# Patient Record
Sex: Female | Born: 1970 | Race: White | Hispanic: No | Marital: Married | Smoking: Never smoker
Health system: Southern US, Community
[De-identification: ages and names within clinical notes are randomized; demographics above are authoritative.]

## PROBLEM LIST (undated history)

## (undated) DIAGNOSIS — T7840XA Allergy, unspecified, initial encounter: Secondary | ICD-10-CM

## (undated) DIAGNOSIS — K589 Irritable bowel syndrome without diarrhea: Secondary | ICD-10-CM

## (undated) DIAGNOSIS — K219 Gastro-esophageal reflux disease without esophagitis: Secondary | ICD-10-CM

## (undated) DIAGNOSIS — M1990 Unspecified osteoarthritis, unspecified site: Secondary | ICD-10-CM

## (undated) DIAGNOSIS — N809 Endometriosis, unspecified: Secondary | ICD-10-CM

## (undated) DIAGNOSIS — J301 Allergic rhinitis due to pollen: Secondary | ICD-10-CM

## (undated) DIAGNOSIS — J309 Allergic rhinitis, unspecified: Secondary | ICD-10-CM

## (undated) HISTORY — DX: Irritable bowel syndrome, unspecified: K58.9

## (undated) HISTORY — DX: Gastro-esophageal reflux disease without esophagitis: K21.9

## (undated) HISTORY — DX: Allergic rhinitis due to pollen: J30.1

## (undated) HISTORY — DX: Endometriosis, unspecified: N80.9

## (undated) HISTORY — PX: UPPER GASTROINTESTINAL ENDOSCOPY: SHX188

## (undated) HISTORY — DX: Unspecified osteoarthritis, unspecified site: M19.90

## (undated) HISTORY — DX: Allergy, unspecified, initial encounter: T78.40XA

## (undated) HISTORY — DX: Allergic rhinitis, unspecified: J30.9

## (undated) HISTORY — DX: Irritable bowel syndrome without diarrhea: K58.9

---

## 1997-06-18 HISTORY — PX: TONSILLECTOMY: SUR1361

## 2004-10-16 ENCOUNTER — Ambulatory Visit: Payer: Self-pay

## 2005-06-18 HISTORY — PX: NASAL STENOSIS REPAIR: SHX2071

## 2005-09-11 ENCOUNTER — Ambulatory Visit: Payer: Self-pay | Admitting: Otolaryngology

## 2006-02-20 ENCOUNTER — Ambulatory Visit: Payer: Self-pay | Admitting: Internal Medicine

## 2006-03-05 ENCOUNTER — Ambulatory Visit: Payer: Self-pay | Admitting: Internal Medicine

## 2006-04-10 ENCOUNTER — Ambulatory Visit: Payer: Self-pay | Admitting: Internal Medicine

## 2008-03-19 ENCOUNTER — Ambulatory Visit: Payer: Self-pay | Admitting: Family Medicine

## 2008-03-19 DIAGNOSIS — J069 Acute upper respiratory infection, unspecified: Secondary | ICD-10-CM | POA: Insufficient documentation

## 2008-06-17 ENCOUNTER — Ambulatory Visit: Payer: Self-pay | Admitting: Family Medicine

## 2008-07-02 ENCOUNTER — Ambulatory Visit: Payer: Self-pay | Admitting: Family Medicine

## 2008-07-02 DIAGNOSIS — R0982 Postnasal drip: Secondary | ICD-10-CM | POA: Insufficient documentation

## 2008-07-15 ENCOUNTER — Ambulatory Visit: Payer: Self-pay | Admitting: Family Medicine

## 2008-07-15 DIAGNOSIS — J3489 Other specified disorders of nose and nasal sinuses: Secondary | ICD-10-CM | POA: Insufficient documentation

## 2008-07-21 ENCOUNTER — Telehealth (INDEPENDENT_AMBULATORY_CARE_PROVIDER_SITE_OTHER): Payer: Self-pay | Admitting: Internal Medicine

## 2009-01-21 ENCOUNTER — Telehealth (INDEPENDENT_AMBULATORY_CARE_PROVIDER_SITE_OTHER): Payer: Self-pay | Admitting: Internal Medicine

## 2009-03-04 ENCOUNTER — Encounter: Payer: Self-pay | Admitting: Internal Medicine

## 2009-03-17 ENCOUNTER — Ambulatory Visit: Payer: Self-pay | Admitting: Family Medicine

## 2009-03-17 DIAGNOSIS — J019 Acute sinusitis, unspecified: Secondary | ICD-10-CM | POA: Insufficient documentation

## 2009-03-21 ENCOUNTER — Telehealth (INDEPENDENT_AMBULATORY_CARE_PROVIDER_SITE_OTHER): Payer: Self-pay | Admitting: Internal Medicine

## 2009-03-22 ENCOUNTER — Telehealth (INDEPENDENT_AMBULATORY_CARE_PROVIDER_SITE_OTHER): Payer: Self-pay | Admitting: Internal Medicine

## 2009-03-31 ENCOUNTER — Encounter (INDEPENDENT_AMBULATORY_CARE_PROVIDER_SITE_OTHER): Payer: Self-pay | Admitting: Internal Medicine

## 2009-04-27 ENCOUNTER — Ambulatory Visit: Payer: Self-pay | Admitting: Otolaryngology

## 2009-05-04 ENCOUNTER — Ambulatory Visit: Payer: Self-pay | Admitting: Otolaryngology

## 2010-11-03 NOTE — Assessment & Plan Note (Signed)
[facility]                           GASTROENTEROLOGY OFFICE NOTE   NAME:Tammie Wright, Tammie Wright                    MRN:          [id number]  DATE:02/20/2006                            DOB:          15-Apr-1971    CHIEF COMPLAINT:  Reflux, irritable bowel.   HISTORY:  This is a 40 year old white woman that says for several years she  has developed alternating bowel habits with hard and loose stools.  There is  sometimes urgent defecation and incontinence.  Sometimes she has to defecate  when she fells like it is just an urge to urinate.  Milk products bother her  and make her gassy and cause urgent defecation.  She also has fairly chronic  heartburn several times a week.  Tomato-based foods make it much worse.  A  14-day course of Prilosec OTC controlled these reflux symptoms quite well,  though they returned when she stopped that.  She has minimized caffeine down  to about two doses a day.  She does not smoke.  She is obese though her  weight has not been progressively increasing.  Recently saw her gynecologist  where TSH and prolactin levels were normal, she tells me.  There is no  bleeding.  There is no family history of inflammatory bowel disease or colon  cancer.   PAST MEDICAL HISTORY:  1. 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  2. Tonsillectomy August 1999.  3. Allergies and sinus problems.   MEDICATIONS:  Clarinex 5 mg daily.   DRUG ALLERGIES:  None known.   SOCIAL HISTORY:  She is married.  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  No alcohol, tobacco or drugs.  No  children.   REVIEW OF SYSTEMS:  Some pain with periods at times.  Last menstrual period  began 02/16/06; just finishing.  She wears eye glasses and she has allergy  problems.   All other systems negative.   PHYSICAL EXAMINATION:  Height 5 feet, weight 172 pounds, blood pressure  100/62, pulse 88.  EYES:   Anicteric.  ENT:  Normal mouth and posterior pharynx.  NECK:  Supple.  No mass.  CHEST:  Clear.  HEART:  S1, S2.  No murmurs or gallops.  ABDOMEN:  Obese, soft, and nontender.  There is no organomegaly or mass.  There is one line of striae in the right lower quadrant.  RECTAL:  Rectal exam with female nursing staff present shows Hemoccult  negative stool.  No mass.  LYMPHATIC:  No neck or supraclavicular nodes.  EXTREMITIES:  No edema.  SKIN:  No rash.  NEURO:  She is alert and oriented x3.   ASSESSMENT:  1. Symptoms most compatible with gastroesophageal reflux disease with      heartburn symptoms for several years, not yet five years.  2. Some crampy abdominal pain in the lower quadrants with alternating      bowel habits.  It sounds like she does have some lactose intolerance      perhaps and it sounds like irritable syndrome to me.   PLAN:  1.  CBC with diff, CMET.  2. Isamine 0.125 mg as needed for cramps and pain.  3. Fiber supplement, samples of Fibersure are given to her to try and see      if this regulates her bowel movements.  4. Handouts on irritable bowel syndrome and gastroesophageal reflux      disease are given.  5. I will see her back in six weeks, she has a pending physical exam with      Dr. Alphonsus Sias pending.  6. I can consider probiotics versus an antibiotic trial for possible small      bowel bacterial overgrowth depending upon her response to the above      measures.                                   Iva Boop, MD,FACG   CEG/MedQ  DD:  02/20/2006  DT:  02/21/2006  Job #:  [id number]   cc:   Karie Schwalbe, MD  Dr. Dallas Schimke

## 2010-11-03 NOTE — Assessment & Plan Note (Signed)
[facility]                           GASTROENTEROLOGY OFFICE NOTE   NAME:Tammie Wright, Tammie Wright                    MRN:          [id number]  DATE:04/10/2006                            DOB:          [birth year]    ASSESSMENT:  1. Gastroesophageal reflux disease, under good control on Prilosec OTC      daily.  2. Irritable bowel syndrome, under good control on fiber supplement with      rare use of hyoscyamine.   See medical history physical form for full details.   VITAL SIGNS:  Weight 173 pounds.  Pulse 64.  Blood pressure 120/74.   PLAN:  1. Continue current therapy.  I do not think she needs a endoscopy at this      time.  After she has had Prilosec for about 5 years (it sounds to me      like she needs this chronically), we can consider to look for      Barrett's' esophagus.  2. Return to see me as needed.       Iva Boop, MD,FACG      CEG/MedQ  DD:  04/10/2006  DT:  04/11/2006  Job #:  [id number]   cc:   Karie Schwalbe, MD

## 2011-03-23 ENCOUNTER — Encounter: Payer: Self-pay | Admitting: Internal Medicine

## 2011-05-16 ENCOUNTER — Encounter: Payer: Self-pay | Admitting: Internal Medicine

## 2011-05-18 ENCOUNTER — Ambulatory Visit (INDEPENDENT_AMBULATORY_CARE_PROVIDER_SITE_OTHER): Payer: BC Managed Care – PPO | Admitting: Internal Medicine

## 2011-05-18 ENCOUNTER — Encounter: Payer: Self-pay | Admitting: Internal Medicine

## 2011-05-18 DIAGNOSIS — K219 Gastro-esophageal reflux disease without esophagitis: Secondary | ICD-10-CM | POA: Insufficient documentation

## 2011-05-18 DIAGNOSIS — J309 Allergic rhinitis, unspecified: Secondary | ICD-10-CM | POA: Insufficient documentation

## 2011-05-18 DIAGNOSIS — K589 Irritable bowel syndrome without diarrhea: Secondary | ICD-10-CM | POA: Insufficient documentation

## 2011-05-18 DIAGNOSIS — Z0000 Encounter for general adult medical examination without abnormal findings: Secondary | ICD-10-CM

## 2011-05-18 LAB — CBC WITH DIFFERENTIAL/PLATELET
Basophils Relative: 0.3 % (ref 0.0–3.0)
Eosinophils Absolute: 0.1 10*3/uL (ref 0.0–0.7)
Hemoglobin: 13.7 g/dL (ref 12.0–15.0)
MCHC: 33.5 g/dL (ref 30.0–36.0)
MCV: 88.1 fl (ref 78.0–100.0)
Monocytes Absolute: 0.4 10*3/uL (ref 0.1–1.0)
Neutro Abs: 4.5 10*3/uL (ref 1.4–7.7)
RBC: 4.64 Mil/uL (ref 3.87–5.11)

## 2011-05-18 LAB — BASIC METABOLIC PANEL
CO2: 27 mEq/L (ref 19–32)
Chloride: 106 mEq/L (ref 96–112)
Sodium: 141 mEq/L (ref 135–145)

## 2011-05-18 LAB — LIPID PANEL
Total CHOL/HDL Ratio: 3
Triglycerides: 119 mg/dL (ref 0.0–149.0)

## 2011-05-18 LAB — HEPATIC FUNCTION PANEL
ALT: 18 U/L (ref 0–35)
Total Protein: 7.9 g/dL (ref 6.0–8.3)

## 2011-05-18 NOTE — Assessment & Plan Note (Signed)
On immunotherapy and zyrtec

## 2011-05-18 NOTE — Assessment & Plan Note (Signed)
Mostly controlled on the omeprazole

## 2011-05-18 NOTE — Assessment & Plan Note (Signed)
Healthy but really out of shape Discussed weight watchers and resuming regular exercise Will check lipid and sugar

## 2011-05-18 NOTE — Progress Notes (Signed)
Subjective:    Patient ID: Tammie Wright, female    DOB: [birth year], 40 y.o.   MRN: [id number]  HPI Here for physical  Gets regular gyn care with Dr Dallas Schimke at [facility] for allergy shots for about 1 year  Uses the omeprazole for reflux Still occ gets some water brash  Weight consistently going up Has not been exercising  No set dietary regimen--discussed weight watchers  Current Outpatient Prescriptions on File Prior to Visit  Medication Sig Dispense Refill  . omeprazole (PRILOSEC) 20 MG capsule Take 20 mg by mouth daily.          No Known Allergies  Past Medical History  Diagnosis Date  . Allergic rhinitis, cause unspecified   . GERD (gastroesophageal reflux disease)     Past Surgical History  Procedure Date  . Tonsillectomy 1999    had remnant removed 2010 by Dr Willeen Cass  . Nasal stenosis repair 2007    Dr Willeen Cass    Family History  Problem Relation Age of Onset  . Hypertension Maternal Grandmother   . Heart disease Maternal Grandfather   . Diabetes Neg Hx     History   Social History  . Marital Status: Married    Spouse Name: N/A    Number of Children: 0  . Years of Education: N/A   Occupational History  . Patient accounts     [employer]   Social History Main Topics  . Smoking status: Never Smoker   . Smokeless tobacco: Not on file  . Alcohol Use: Not on file  . Drug Use: Not on file  . Sexually Active: Not on file   Other Topics Concern  . Not on file   Social History Narrative  . No narrative on file   Review of Systems  Constitutional: Positive for unexpected weight change. Negative for fatigue.       Wears seat belt  HENT: Positive for congestion and rhinorrhea. Negative for hearing loss, dental problem and tinnitus.        Keeps up with dentist  Eyes: Negative for visual disturbance.       No diplopia or unilateral vision loss Recent eye exam  Respiratory: Negative for cough, chest tightness and shortness of breath.    Cardiovascular: Negative for chest pain, palpitations and leg swelling.  Gastrointestinal: Positive for abdominal pain. Negative for nausea, vomiting and blood in stool.       Occ pain with IBS flare Constipation alternates with diarrhea--more active when has her period Hasn't needed meds Heartburn generally controlled  Genitourinary: Negative for dysuria, difficulty urinating and dyspareunia.       No incontinence  Musculoskeletal: Negative for back pain and joint swelling.       Occ soreness in hands--on keyboard computer all day  Skin: Positive for rash.       Has itchy spot on upper arm--cortisone helps  Neurological: Negative for dizziness, syncope, weakness, light-headedness, numbness and headaches.  Hematological: Negative for adenopathy. Does not bruise/bleed easily.  Psychiatric/Behavioral: Negative for sleep disturbance and dysphoric mood. The patient is not nervous/anxious.        Objective:   Physical Exam  Constitutional: She is oriented to person, place, and time. She appears well-developed and well-nourished. No distress.  HENT:  Head: Normocephalic and atraumatic.  Right Ear: External ear normal.  Left Ear: External ear normal.  Mouth/Throat: Oropharynx is clear and moist. No oropharyngeal exudate.       TMs normal  Eyes: Conjunctivae  and EOM are normal. Pupils are equal, round, and reactive to light.       Fundi benign  Neck: Normal range of motion. Neck supple. No thyromegaly present.  Cardiovascular: Normal rate, regular rhythm, normal heart sounds and intact distal pulses.  Exam reveals no gallop.   No murmur heard. Pulmonary/Chest: Effort normal and breath sounds normal. No respiratory distress. She has no wheezes. She has no rales.  Abdominal: Soft. There is no tenderness.  Musculoskeletal: Normal range of motion. She exhibits no edema and no tenderness.  Lymphadenopathy:    She has no cervical adenopathy.  Neurological: She is alert and oriented to  person, place, and time.  Skin: Skin is warm. Rash noted.       Mycotic left great toenail Mild irritative rash on left arm  Psychiatric: She has a normal mood and affect. Her behavior is normal. Judgment and thought content normal.          Assessment & Plan:

## 2011-06-19 HISTORY — PX: OTHER SURGICAL HISTORY: SHX169

## 2012-06-18 HISTORY — PX: OTHER SURGICAL HISTORY: SHX169

## 2013-02-16 HISTORY — PX: COLONOSCOPY: SHX174

## 2013-03-11 ENCOUNTER — Ambulatory Visit: Payer: Self-pay | Admitting: Gastroenterology

## 2013-06-01 ENCOUNTER — Encounter: Payer: Self-pay | Admitting: Family Medicine

## 2013-06-01 ENCOUNTER — Ambulatory Visit (INDEPENDENT_AMBULATORY_CARE_PROVIDER_SITE_OTHER): Payer: BC Managed Care – PPO | Admitting: Family Medicine

## 2013-06-01 VITALS — BP 124/80 | HR 110 | Temp 98.2°F | Ht 59.75 in | Wt 197.8 lb

## 2013-06-01 DIAGNOSIS — M542 Cervicalgia: Secondary | ICD-10-CM

## 2013-06-01 NOTE — Progress Notes (Signed)
Pre-visit discussion using our clinic review tool. No additional management support is needed unless otherwise documented below in the visit note.  
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
## 2013-06-03 ENCOUNTER — Encounter: Payer: Self-pay | Admitting: *Deleted

## 2013-06-03 ENCOUNTER — Telehealth: Payer: Self-pay | Admitting: *Deleted
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
## 2014-01-16 HISTORY — PX: OOPHORECTOMY: SHX86

## 2014-01-25 ENCOUNTER — Ambulatory Visit: Payer: Self-pay | Admitting: Obstetrics and Gynecology

## 2014-01-25 LAB — CBC
HCT: 44.2 % (ref 35.0–47.0)
HGB: 14.3 g/dL (ref 12.0–16.0)
MCH: 30.6 pg (ref 26.0–34.0)
MCHC: 32.3 g/dL (ref 32.0–36.0)
MCV: 95 fL (ref 80–100)
Platelet: 304 10*3/uL (ref 150–440)
RBC: 4.66 10*6/uL (ref 3.80–5.20)
RDW: 12.8 % (ref 11.5–14.5)
WBC: 9.5 10*3/uL (ref 3.6–11.0)

## 2014-01-25 LAB — BASIC METABOLIC PANEL
Anion Gap: 6 — ABNORMAL LOW (ref 7–16)
BUN: 8 mg/dL (ref 7–18)
Calcium, Total: 8.3 mg/dL — ABNORMAL LOW (ref 8.5–10.1)
Chloride: 105 mmol/L (ref 98–107)
Co2: 30 mmol/L (ref 21–32)
Creatinine: 0.77 mg/dL (ref 0.60–1.30)
EGFR (African American): >60
EGFR (Non-African Amer.): >60
Glucose: 122 mg/dL — ABNORMAL HIGH (ref 65–99)
Osmolality: 281 (ref 275–301)
Potassium: 3.7 mmol/L (ref 3.5–5.1)
Sodium: 141 mmol/L (ref 136–145)

## 2014-01-25 LAB — SEDIMENTATION RATE: Erythrocyte Sed Rate: 14 mm/hr (ref 0–20)

## 2014-02-04 ENCOUNTER — Ambulatory Visit: Payer: Self-pay | Admitting: Obstetrics and Gynecology

## 2014-02-05 LAB — PATHOLOGY REPORT

## 2014-05-18 ENCOUNTER — Ambulatory Visit: Payer: Self-pay | Admitting: Obstetrics and Gynecology

## 2014-09-03 ENCOUNTER — Encounter: Payer: Self-pay | Admitting: Internal Medicine

## 2014-10-09 NOTE — Op Note (Signed)
PATIENT NAME:  Tammie Wright, Tammie Wright MR#:  [id number] DATE OF BIRTH:  July 25, 1970  DATE OF PROCEDURE:  02/04/2014  PROCEDURE PERFORMED: Laparoscopic left salpingo-oophorectomy, right cystectomy, right salpingectomy and peritoneal biopsies.     PREOPERATIVE DIAGNOSIS: Right ovarian cyst, left severe dyspareunia and constant pain unresponsive to medical management.   ESTIMATED BLOOD LOSS: 50 mL.   FINDINGS: Bowel adhered to the left side wall encompassing the left tube and ovary with large amounts of endometriosis coursing over the ureter with posterior cul-de-sac endometriosis implants, as well as Wright right ovarian cyst, right normal tube and peritoneal implants of endometriosis at the bladder.   DESCRIPTION OF PROCEDURE: The patient was taken to the Operating Room and placed in supine position. After adequate general endotracheal anesthesia was instilled, the patient was prepped and draped in the usual sterile fashion. Wright side-opening speculum was placed in the patient's vagina. The anterior lip of the cervix was grasped with Wright single-tooth tenaculum. The uterus was sounded and Wright Hulka tenaculum was placed.  Speculum was removed.  Umbilicus was injected with Marcaine. An incision was made.  Veress needle was placed.  Hang drop test, fluid instillation test, and fluid aspiration test showed proper placement of the Veress needle. The CO2 was then placed on low flow.  When tympany was heard around the liver, CO2 was placed on high flow.  The Veress needle was removed and Wright 10 mm trocar port was placed through the umbilicus.  The patient was placed in Trendelenburg and the aforementioned findings were seen. The right ovarian cyst was ruptured.  The right tube was grasped and doused with Marcaine. Harmonic scalpel was used to cut across the broad ligament and the tube was removed. The stump was approximately 1.5 cm and was cauterized.  Attention was then turned to the left side where the infundibulopelvic ligament was  dissected out of the endometriosis.  The bowels were dissected free.  The ureter was identified and traced.  Endometriosis was found to be on top of the ureter.  The IP was clamped and cauterized with the Harmonic scalpel.  The broad ligament was incised to the approximately 1.5 cm from the uterus and the tube was cut across.  The tube and ovary were then removed through the trocar ports.  The ureter was identified and found to be free from endometriosis and free from any injury.  The posterior peritoneum and the bladder peritoneum was injected with Marcaine and the Harmonic scalpel was used to remove the implants.  These implants were then taken and sent to pathology.  The remainder of the Marcaine was placed in the patient's abdomen and the trocars were removed.  CO2 was allowed to escape. Good hemostasis was identified.  The ports were sewn and placed with Dermabond.  Bandages were placed.   Hulka tenaculum was removed.  The patient was taken to recovery after having tolerated the procedure well.      ____________________________ Elliot Gurneyarrie C. Kiowa Peifer, MD cck:DT D: 02/09/2014 09:58:07 ET T: 02/09/2014 15:32:25 ET JOB#: [id number]  cc: Elliot Gurneyarrie C. Savio Albrecht, MD, <Dictator> Elliot GurneyARRIE C Maksym Pfiffner MD ELECTRONICALLY SIGNED 02/09/2014 20:34

## 2015-07-21 ENCOUNTER — Other Ambulatory Visit: Payer: Self-pay | Admitting: Obstetrics and Gynecology

## 2015-07-21 DIAGNOSIS — Z1231 Encounter for screening mammogram for malignant neoplasm of breast: Secondary | ICD-10-CM

## 2015-09-05 ENCOUNTER — Ambulatory Visit
Admission: RE | Admit: 2015-09-05 | Discharge: 2015-09-05 | Disposition: A | Discharge status: Home / Self Care | Payer: BC Managed Care – PPO | Source: Ambulatory Visit | Attending: Obstetrics and Gynecology | Admitting: Obstetrics and Gynecology

## 2015-09-05 DIAGNOSIS — Z1231 Encounter for screening mammogram for malignant neoplasm of breast: Secondary | ICD-10-CM | POA: Diagnosis present

## 2015-09-05 LAB — HM PAP SMEAR: HM Pap smear: NORMAL

## 2015-11-30 ENCOUNTER — Ambulatory Visit: Payer: BC Managed Care – PPO | Admitting: Internal Medicine

## 2016-01-23 ENCOUNTER — Encounter: Payer: Self-pay | Admitting: Internal Medicine

## 2016-01-23 ENCOUNTER — Ambulatory Visit (INDEPENDENT_AMBULATORY_CARE_PROVIDER_SITE_OTHER): Payer: BC Managed Care – PPO | Admitting: Internal Medicine

## 2016-01-23 DIAGNOSIS — K219 Gastro-esophageal reflux disease without esophagitis: Secondary | ICD-10-CM | POA: Diagnosis not present

## 2016-01-23 DIAGNOSIS — J301 Allergic rhinitis due to pollen: Secondary | ICD-10-CM | POA: Diagnosis not present

## 2016-01-23 MED ORDER — PANTOPRAZOLE SODIUM 40 MG PO TBEC: 40.0000 mg | DELAYED_RELEASE_TABLET | Freq: Every day | ORAL | 3 refills | Status: DC

## 2016-01-23 MED ORDER — MONTELUKAST SODIUM 10 MG PO TABS: 10.0000 mg | ORAL_TABLET | Freq: Every day | ORAL | 3 refills | Status: DC

## 2016-01-23 NOTE — Assessment & Plan Note (Signed)
Most troubling problem Will add montelukast Consider adding fexofenadine also May need to consider immunotherapy

## 2016-01-23 NOTE — Progress Notes (Signed)
Pre visit review using our clinic review tool, if applicable. No additional management support is needed unless otherwise documented below in the visit note. 

## 2016-01-23 NOTE — Assessment & Plan Note (Signed)
Does well with the PPI 

## 2016-01-23 NOTE — Patient Instructions (Signed)
Please add the montelukast daily (singulair) to the flonase and cetirizine. You can also add fexofenadine  daily in addition. If all that doesn't help, let me know.

## 2016-01-23 NOTE — Progress Notes (Signed)
Subjective:    Patient ID: Tammie Wright, female    DOB: 1971/04/17, 45 y.o.   MRN: [id number]  HPI Here to reestablish Hasn't been seen in 5 years [facility]  Having a lot of problems with her allergies Cetirizine helps a little Loratadine doesn't do anything Still has itchy eyes and ears No wheezing, cough or SOB (only coughs if bad PND) Has used fexofenadine in past--that did help Still on flonase Drops for eyes from eye doctor Montelukast in past-- it did help somewhat (got from her ENT) Symptoms all year round No animals Carpeting through house--- did remove some--but floor is dusty Discussed plastic bedding covers  Needs Rx for her protonix This controls her reflux Started in 2014 by Dr Shelle Ironein after EGD  Current Outpatient Prescriptions on File Prior to Visit  Medication Sig Dispense Refill  . cetirizine (ZYRTEC) 10 MG tablet Take 10 mg by mouth daily.      . pantoprazole (PROTONIX) 40 MG tablet Take 40 mg by mouth daily.    . fluticasone (FLONASE) 50 MCG/ACT nasal spray Place 1 spray into the nose daily.       No current facility-administered medications on file prior to visit.     No Known Allergies  Past Medical History:  Diagnosis Date  . Allergic rhinitis due to pollen   . Allergic rhinitis, cause unspecified   . Endometriosis   . GERD (gastroesophageal reflux disease)   . IBS (irritable bowel syndrome)     Past Surgical History:  Procedure Laterality Date  . COLONOSCOPY  9/14   and EGD--Dr Shelle Ironein  . NASAL STENOSIS REPAIR  2007   Dr Willeen CassBennett  . OOPHORECTOMY Left 01/2014   and both tubes---for endometriosis  . TONSILLECTOMY  1999   had remnant removed 2010 by Dr Willeen CassBennett  . Uterine ablation  2014    Family History  Problem Relation Age of Onset  . Cancer Father     bladder cancer  . Hypertension Maternal Grandmother   . Heart disease Maternal Grandfather   . Cancer Paternal Aunt     breast cancer  . Diabetes Neg Hx      Social History   Social History  . Marital status: Married    Spouse name: N/A  . Number of children: 0  . Years of education: N/A   Occupational History  . Patient accounts     [employer]   Social History Main Topics  . Smoking status: Never Smoker  . Smokeless tobacco: Never Used  . Alcohol use No  . Drug use: No  . Sexual activity: Not on file   Other Topics Concern  . Not on file   Social History Narrative  . No narrative on file   Review of Systems  Has lost 13# since last visit No specific effort--some increased walking Sleeps great    Objective:   Physical Exam  Constitutional: She appears well-developed and well-nourished. No distress.  HENT:  Mouth/Throat: Oropharynx is clear and moist. No oropharyngeal exudate.  Moderate nasal inflammation--mostly on right TMs normal  Neck: Normal range of motion. Neck supple. No thyromegaly present.  Cardiovascular: Normal rate, regular rhythm and normal heart sounds.  Exam reveals no gallop.   No murmur heard. Pulmonary/Chest: Effort normal and breath sounds normal. No respiratory distress. She has no wheezes. She has no rales.  Abdominal: Soft. There is no tenderness.  Musculoskeletal: She exhibits no edema.  Lymphadenopathy:    She has no cervical  adenopathy.  Psychiatric: She has a normal mood and affect. Her behavior is normal.          Assessment & Plan:

## 2016-03-21 ENCOUNTER — Encounter: Payer: Self-pay | Admitting: Internal Medicine

## 2016-03-22 MED ORDER — DICYCLOMINE HCL 10 MG PO CAPS: 10.0000 mg | ORAL_CAPSULE | Freq: Three times a day (TID) | ORAL | 0 refills | Status: DC | PRN

## 2016-05-17 ENCOUNTER — Encounter: Payer: Self-pay | Admitting: Internal Medicine

## 2016-11-20 ENCOUNTER — Encounter: Payer: Self-pay | Admitting: Internal Medicine

## 2016-11-21 ENCOUNTER — Encounter: Payer: Self-pay | Admitting: Internal Medicine

## 2016-11-21 ENCOUNTER — Ambulatory Visit (INDEPENDENT_AMBULATORY_CARE_PROVIDER_SITE_OTHER): Payer: BC Managed Care – PPO | Admitting: Internal Medicine

## 2016-11-21 ENCOUNTER — Ambulatory Visit (INDEPENDENT_AMBULATORY_CARE_PROVIDER_SITE_OTHER)
Admission: RE | Admit: 2016-11-21 | Discharge: 2016-11-21 | Disposition: A | Discharge status: Home / Self Care | Payer: BC Managed Care – PPO | Source: Ambulatory Visit | Attending: Internal Medicine | Admitting: Internal Medicine

## 2016-11-21 DIAGNOSIS — M79672 Pain in left foot: Secondary | ICD-10-CM

## 2016-11-21 NOTE — Assessment & Plan Note (Addendum)
And bony prominence at 3rd metatarsal ?related to wearing heels more often--clearly seems mechanical X-ray reassuring Discussed supportive care

## 2016-11-21 NOTE — Progress Notes (Signed)
   Subjective:    Patient ID: Tammie Wright, female    DOB: Apr 14, 1971, 46 y.o.   MRN: [id number]  HPI Here due to foot pain  Didn't remember any injury Then noticed bump on top of left foot several days ago---but then noticed it very severe  2 days ago  Has been wearing heels more often No redness No discharge  Current Outpatient Prescriptions on File Prior to Visit  Medication Sig Dispense Refill  . cetirizine (ZYRTEC) 10 MG tablet Take 10 mg by mouth daily.      Marland Kitchen. dicyclomine (BENTYL) 10 MG capsule Take 1 capsule (10 mg total) by mouth 3 (three) times daily as needed for spasms. 60 capsule 0  . fluticasone (FLONASE) 50 MCG/ACT nasal spray Place 1 spray into the nose daily.      . montelukast (SINGULAIR) 10 MG tablet Take 1 tablet (10 mg total) by mouth daily. 90 tablet 3  . pantoprazole (PROTONIX) 40 MG tablet Take 1 tablet (40 mg total) by mouth daily. 90 tablet 3   No current facility-administered medications on file prior to visit.     No Known Allergies  Past Medical History:  Diagnosis Date  . Allergic rhinitis due to pollen   . Allergic rhinitis, cause unspecified   . Endometriosis   . GERD (gastroesophageal reflux disease)   . IBS (irritable bowel syndrome)     Past Surgical History:  Procedure Laterality Date  . COLONOSCOPY  9/14   and EGD--Dr Shelle Ironein  . NASAL STENOSIS REPAIR  2007   Dr Willeen CassBennett  . OOPHORECTOMY Left 01/2014   and both tubes---for endometriosis  . TONSILLECTOMY  1999   had remnant removed 2010 by Dr Willeen CassBennett  . Uterine ablation  2014    Family History  Problem Relation Age of Onset  . Cancer Father        bladder cancer  . Hypertension Maternal Grandmother   . Heart disease Maternal Grandfather   . Cancer Paternal Aunt        breast cancer  . Diabetes Neg Hx     Social History   Social History  . Marital status: Married    Spouse name: N/A  . Number of children: 0  . Years of education: N/A   Occupational History  . Patient  accounts     [employer]   Social History Main Topics  . Smoking status: Never Smoker  . Smokeless tobacco: Never Used  . Alcohol use No  . Drug use: No  . Sexual activity: Not on file   Other Topics Concern  . Not on file   Social History Narrative  . No narrative on file   Review of Systems No fever Feels well other than worsened sinus symptoms in past day or so    Objective:   Physical Exam  Musculoskeletal:  Tenderness and prominence at proximal edge of 3rd metatarsal head No redness or warmth          Assessment & Plan:

## 2016-11-23 ENCOUNTER — Encounter: Payer: Self-pay | Admitting: Internal Medicine

## 2017-01-17 ENCOUNTER — Encounter: Payer: Self-pay | Admitting: Internal Medicine

## 2017-01-17 MED ORDER — MONTELUKAST SODIUM 10 MG PO TABS: 10.0000 mg | ORAL_TABLET | Freq: Every day | ORAL | 3 refills | Status: DC

## 2017-01-25 ENCOUNTER — Ambulatory Visit (INDEPENDENT_AMBULATORY_CARE_PROVIDER_SITE_OTHER): Payer: BC Managed Care – PPO | Admitting: Internal Medicine

## 2017-01-25 ENCOUNTER — Encounter: Payer: Self-pay | Admitting: Internal Medicine

## 2017-01-25 VITALS — BP 130/88 | HR 103 | Temp 98.0°F | Ht 59.75 in | Wt 190.0 lb

## 2017-01-25 DIAGNOSIS — Z0000 Encounter for general adult medical examination without abnormal findings: Secondary | ICD-10-CM | POA: Diagnosis not present

## 2017-01-25 DIAGNOSIS — E669 Obesity, unspecified: Secondary | ICD-10-CM | POA: Diagnosis not present

## 2017-01-25 DIAGNOSIS — Z23 Encounter for immunization: Secondary | ICD-10-CM | POA: Diagnosis not present

## 2017-01-25 LAB — CBC WITH DIFFERENTIAL/PLATELET
Basophils Absolute: 0 K/uL (ref 0.0–0.1)
Basophils Relative: 0.3 % (ref 0.0–3.0)
Eosinophils Absolute: 0.1 K/uL (ref 0.0–0.7)
Eosinophils Relative: 1.1 % (ref 0.0–5.0)
HCT: 42.5 % (ref 36.0–46.0)
Hemoglobin: 14.1 g/dL (ref 12.0–15.0)
Lymphocytes Relative: 20.8 % (ref 12.0–46.0)
Lymphs Abs: 1.4 K/uL (ref 0.7–4.0)
MCHC: 33.2 g/dL (ref 30.0–36.0)
MCV: 94.9 fl (ref 78.0–100.0)
Monocytes Absolute: 0.4 K/uL (ref 0.1–1.0)
Monocytes Relative: 6.1 % (ref 3.0–12.0)
Neutro Abs: 4.8 K/uL (ref 1.4–7.7)
Neutrophils Relative %: 71.7 % (ref 43.0–77.0)
Platelets: 334 K/uL (ref 150.0–400.0)
RBC: 4.48 Mil/uL (ref 3.87–5.11)
RDW: 12.6 % (ref 11.5–15.5)
WBC: 6.6 K/uL (ref 4.0–10.5)

## 2017-01-25 LAB — COMPREHENSIVE METABOLIC PANEL
ALBUMIN: 4.4 g/dL (ref 3.5–5.2)
ALT: 17 U/L (ref 0–35)
AST: 14 U/L (ref 0–37)
Alkaline Phosphatase: 65 U/L (ref 39–117)
BILIRUBIN TOTAL: 0.4 mg/dL (ref 0.2–1.2)
BUN: 17 mg/dL (ref 6–23)
CALCIUM: 9.4 mg/dL (ref 8.4–10.5)
CHLORIDE: 103 meq/L (ref 96–112)
CO2: 29 mEq/L (ref 19–32)
CREATININE: 0.79 mg/dL (ref 0.40–1.20)
GFR: 83.91 mL/min (ref >60.00)
Glucose, Bld: 107 mg/dL — ABNORMAL HIGH (ref 70–99)
Potassium: 4.1 mEq/L (ref 3.5–5.1)
SODIUM: 138 meq/L (ref 135–145)
Total Protein: 7.8 g/dL (ref 6.0–8.3)

## 2017-01-25 LAB — LIPID PANEL
CHOLESTEROL: 150 mg/dL (ref 0–200)
HDL: 48.8 mg/dL (ref >39.00)
LDL Cholesterol: 76 mg/dL (ref 0–99)
NonHDL: 101.19
TRIGLYCERIDES: 127 mg/dL (ref 0.0–149.0)
Total CHOL/HDL Ratio: 3
VLDL: 25.4 mg/dL (ref 0.0–40.0)

## 2017-01-25 LAB — T4, FREE: Free T4: 0.78 ng/dL (ref 0.60–1.60)

## 2017-01-25 MED ORDER — PANTOPRAZOLE SODIUM 40 MG PO TBEC: 40.0000 mg | DELAYED_RELEASE_TABLET | Freq: Every day | ORAL | 3 refills | Status: DC

## 2017-01-25 NOTE — Assessment & Plan Note (Signed)
Healthy but needs to work more on fitness Going to gyn Td booster Recommended yearly flu vaccine

## 2017-01-25 NOTE — Patient Instructions (Addendum)
Please consider an appointment with Dr Patsy Lageropland to help with custom orthotics or if your foot pain persists.  DASH Eating Plan DASH stands for "Dietary Approaches to Stop Hypertension." The DASH eating plan is a healthy eating plan that has been shown to reduce high blood pressure (hypertension). It may also reduce your risk for type 2 diabetes, heart disease, and stroke. The DASH eating plan may also help with weight loss. What are tips for following this plan? General guidelines  Avoid eating more than 2,300 mg (milligrams) of salt (sodium) a day. If you have hypertension, you may need to reduce your sodium intake to 1,500 mg a day.  Limit alcohol intake to no more than 1 drink a day for nonpregnant women and 2 drinks a day for men. One drink equals 12 oz of beer, 5 oz of wine, or 1 oz of hard liquor.  Work with your health care provider to maintain a healthy body weight or to lose weight. Ask what an ideal weight is for you.  Get at least 30 minutes of exercise that causes your heart to beat faster (aerobic exercise) most days of the week. Activities may include walking, swimming, or biking.  Work with your health care provider or diet and nutrition specialist (dietitian) to adjust your eating plan to your individual calorie needs. Reading food labels  Check food labels for the amount of sodium per serving. Choose foods with less than 5 percent of the Daily Value of sodium. Generally, foods with less than 300 mg of sodium per serving fit into this eating plan.  To find whole grains, look for the word "whole" as the first word in the ingredient list. Shopping  Buy products labeled as "low-sodium" or "no salt added."  Buy fresh foods. Avoid canned foods and premade or frozen meals. Cooking  Avoid adding salt when cooking. Use salt-free seasonings or herbs instead of table salt or sea salt. Check with your health care provider or pharmacist before using salt substitutes.  Do not fry  foods. Cook foods using healthy methods such as baking, boiling, grilling, and broiling instead.  Cook with heart-healthy oils, such as olive, canola, soybean, or sunflower oil. Meal planning   Eat a balanced diet that includes: ? 5 or more servings of fruits and vegetables each day. At each meal, try to fill half of your plate with fruits and vegetables. ? Up to 6-8 servings of whole grains each day. ? Less than 6 oz of lean meat, poultry, or fish each day. A 3-oz serving of meat is about the same size as a deck of cards. One egg equals 1 oz. ? 2 servings of low-fat dairy each day. ? A serving of nuts, seeds, or beans 5 times each week. ? Heart-healthy fats. Healthy fats called Omega-3 fatty acids are found in foods such as flaxseeds and coldwater fish, like sardines, salmon, and mackerel.  Limit how much you eat of the following: ? Canned or prepackaged foods. ? Food that is high in trans fat, such as fried foods. ? Food that is high in saturated fat, such as fatty meat. ? Sweets, desserts, sugary drinks, and other foods with added sugar. ? Full-fat dairy products.  Do not salt foods before eating.  Try to eat at least 2 vegetarian meals each week.  Eat more home-cooked food and less restaurant, buffet, and fast food.  When eating at a restaurant, ask that your food be prepared with less salt or no salt, if possible.  What foods are recommended? The items listed may not be a complete list. Talk with your dietitian about what dietary choices are best for you. Grains Whole-grain or whole-wheat bread. Whole-grain or whole-wheat pasta. Brown rice. Modena Morrow. Bulgur. Whole-grain and low-sodium cereals. Pita bread. Low-fat, low-sodium crackers. Whole-wheat flour tortillas. Vegetables Fresh or frozen vegetables (raw, steamed, roasted, or grilled). Low-sodium or reduced-sodium tomato and vegetable juice. Low-sodium or reduced-sodium tomato sauce and tomato paste. Low-sodium or  reduced-sodium canned vegetables. Fruits All fresh, dried, or frozen fruit. Canned fruit in natural juice (without added sugar). Meat and other protein foods Skinless chicken or [place]. Ground chicken or [place]. Pork with fat trimmed off. Fish and seafood. Egg whites. Dried beans, peas, or lentils. Unsalted nuts, nut butters, and seeds. Unsalted canned beans. Lean cuts of beef with fat trimmed off. Low-sodium, lean deli meat. Dairy Low-fat (1%) or fat-free (skim) milk. Fat-free, low-fat, or reduced-fat cheeses. Nonfat, low-sodium ricotta or cottage cheese. Low-fat or nonfat yogurt. Low-fat, low-sodium cheese. Fats and oils Soft margarine without trans fats. Vegetable oil. Low-fat, reduced-fat, or light mayonnaise and salad dressings (reduced-sodium). Canola, safflower, olive, soybean, and sunflower oils. Avocado. Seasoning and other foods Herbs. Spices. Seasoning mixes without salt. Unsalted popcorn and pretzels. Fat-free sweets. What foods are not recommended? The items listed may not be a complete list. Talk with your dietitian about what dietary choices are best for you. Grains Baked goods made with fat, such as croissants, muffins, or some breads. Dry pasta or rice meal packs. Vegetables Creamed or fried vegetables. Vegetables in a cheese sauce. Regular canned vegetables (not low-sodium or reduced-sodium). Regular canned tomato sauce and paste (not low-sodium or reduced-sodium). Regular tomato and vegetable juice (not low-sodium or reduced-sodium). Angie Fava. Olives. Fruits Canned fruit in a light or heavy syrup. Fried fruit. Fruit in cream or butter sauce. Meat and other protein foods Fatty cuts of meat. Ribs. Fried meat. Berniece Salines. Sausage. Bologna and other processed lunch meats. Salami. Fatback. Hotdogs. Bratwurst. Salted nuts and seeds. Canned beans with added salt. Canned or smoked fish. Whole eggs or egg yolks. Chicken or [place] with skin. Dairy Whole or 2% milk, cream, and half-and-half.  Whole or full-fat cream cheese. Whole-fat or sweetened yogurt. Full-fat cheese. Nondairy creamers. Whipped toppings. Processed cheese and cheese spreads. Fats and oils Butter. Stick margarine. Lard. Shortening. Ghee. Bacon fat. Tropical oils, such as coconut, palm kernel, or palm oil. Seasoning and other foods Salted popcorn and pretzels. Onion salt, garlic salt, seasoned salt, table salt, and sea salt. Worcestershire sauce. Tartar sauce. Barbecue sauce. Teriyaki sauce. Soy sauce, including reduced-sodium. Steak sauce. Canned and packaged gravies. Fish sauce. Oyster sauce. Cocktail sauce. Horseradish that you find on the shelf. Ketchup. Mustard. Meat flavorings and tenderizers. Bouillon cubes. Hot sauce and Tabasco sauce. Premade or packaged marinades. Premade or packaged taco seasonings. Relishes. Regular salad dressings. Where to find more information:  National Heart, Lung, and [place]: https://wilson-eaton.com/  American Heart Association: www.heart.org Summary  The DASH eating plan is a healthy eating plan that has been shown to reduce high blood pressure (hypertension). It may also reduce your risk for type 2 diabetes, heart disease, and stroke.  With the DASH eating plan, you should limit salt (sodium) intake to 2,300 mg a day. If you have hypertension, you may need to reduce your sodium intake to 1,500 mg a day.  When on the DASH eating plan, aim to eat more fresh fruits and vegetables, whole grains, lean proteins, low-fat dairy, and heart-healthy fats.  Work  with your health care provider or diet and nutrition specialist (dietitian) to adjust your eating plan to your individual calorie needs. This information is not intended to replace advice given to you by your health care provider. Make sure you discuss any questions you have with your health care provider. Document Released: 05/24/2011 Document Revised: 05/28/2016 Document Reviewed: 05/28/2016 Elsevier Interactive Patient Education   2017 [employer].

## 2017-01-25 NOTE — Addendum Note (Signed)
Addended by: Eual FinesBRIDGES, Sherlynn Tourville P on: 01/25/2017 09:39 AM   Modules accepted: Orders

## 2017-01-25 NOTE — Telephone Encounter (Signed)
Rx sent electronically.  

## 2017-01-25 NOTE — Assessment & Plan Note (Signed)
DASH info Discussed increased exercise Will check labs--sugar, lipid, etc

## 2017-01-25 NOTE — Progress Notes (Signed)
Subjective:    Patient ID: Tammie PattenElizabeth A Evola, female    DOB: [birth year], 46 y.o.   MRN: [id number]  HPI Here for physical  Still some foot pain---no worse Only notices if walking uphill  Allergies had been fine till this summer Discussed filters, etc No pets Past immunotherapy--- not excited it has helped  Keeps up with gyn--appt in October  Current Outpatient Prescriptions on File Prior to Visit  Medication Sig Dispense Refill  . cetirizine (ZYRTEC) 10 MG tablet Take 10 mg by mouth daily.      Marland Kitchen. dicyclomine (BENTYL) 10 MG capsule Take 1 capsule (10 mg total) by mouth 3 (three) times daily as needed for spasms. 60 capsule 0  . fexofenadine (ALLEGRA) 180 MG tablet Take 180 mg by mouth daily.    . fluticasone (FLONASE) 50 MCG/ACT nasal spray Place 1 spray into the nose daily.      . montelukast (SINGULAIR) 10 MG tablet Take 1 tablet (10 mg total) by mouth daily. 90 tablet 3  . pantoprazole (PROTONIX) 40 MG tablet Take 1 tablet (40 mg total) by mouth daily. 90 tablet 3   No current facility-administered medications on file prior to visit.     No Known Allergies  Past Medical History:  Diagnosis Date  . Allergic rhinitis due to pollen   . Allergic rhinitis, cause unspecified   . Endometriosis   . GERD (gastroesophageal reflux disease)   . IBS (irritable bowel syndrome)     Past Surgical History:  Procedure Laterality Date  . COLONOSCOPY  9/14   and EGD--Dr Shelle Ironein  . NASAL STENOSIS REPAIR  2007   Dr Willeen CassBennett  . OOPHORECTOMY Left 01/2014   and both tubes---for endometriosis  . TONSILLECTOMY  1999   had remnant removed 2010 by Dr Willeen CassBennett  . Uterine ablation  2014    Family History  Problem Relation Age of Onset  . Cancer Father        bladder cancer  . Hypertension Maternal Grandmother   . Heart disease Maternal Grandfather   . Cancer Paternal Aunt        breast cancer  . Diabetes Neg Hx     Social History   Social History  . Marital status: Married   Spouse name: N/A  . Number of children: 0  . Years of education: N/A   Occupational History  . Patient accounts     [employer]   Social History Main Topics  . Smoking status: Never Smoker  . Smokeless tobacco: Never Used  . Alcohol use No  . Drug use: No  . Sexual activity: Not on file   Other Topics Concern  . Not on file   Social History Narrative  . No narrative on file   Review of Systems  Constitutional: Negative for fatigue and unexpected weight change.       Trying to walk more Wears seat belt  HENT: Negative for dental problem, hearing loss, tinnitus and trouble swallowing.        Keeps up with dentist  Eyes: Negative for visual disturbance.       No diplopia or unilateral vision loss  Respiratory: Negative for chest tightness and shortness of breath.        Cough from allergy drainage--not much  Cardiovascular: Negative for chest pain, palpitations and leg swelling.  Gastrointestinal: Negative for abdominal pain, blood in stool, constipation and nausea.       Protonix controls symptoms  Endocrine: Negative for polydipsia and polyuria.  Genitourinary: Negative for dysuria and hematuria.       Periods are erratic Off OCP--was just for endometriosis  Musculoskeletal: Negative for back pain and joint swelling.       Just the foot pain  Skin: Negative for rash.       No suspicious lesions   Allergic/Immunologic: Positive for environmental allergies. Negative for immunocompromised state.  Neurological: Negative for dizziness, syncope and light-headedness.       Rare sinus headaches  Hematological: Negative for adenopathy. Does not bruise/bleed easily.  Psychiatric/Behavioral: Negative for dysphoric mood and sleep disturbance. The patient is not nervous/anxious.        Objective:   Physical Exam  Constitutional: She is oriented to person, place, and time. She appears well-developed and well-nourished. No distress.  HENT:  Head: Normocephalic and atraumatic.  Right  Ear: External ear normal.  Left Ear: External ear normal.  Mouth/Throat: Oropharynx is clear and moist. No oropharyngeal exudate.  Eyes: Pupils are equal, round, and reactive to light. Conjunctivae are normal.  Neck: Normal range of motion. No thyromegaly present.  Cardiovascular: Normal rate, regular rhythm, normal heart sounds and intact distal pulses.  Exam reveals no gallop.   No murmur heard. Pulmonary/Chest: Effort normal and breath sounds normal. No respiratory distress. She has no wheezes. She has no rales.  Abdominal: Soft. There is no tenderness.  Musculoskeletal: She exhibits no edema or tenderness.  Lymphadenopathy:    She has no cervical adenopathy.  Neurological: She is alert and oriented to person, place, and time.  Skin: No rash noted. No erythema.  Psychiatric: She has a normal mood and affect. Her behavior is normal.          Assessment & Plan:

## 2017-02-13 ENCOUNTER — Ambulatory Visit (INDEPENDENT_AMBULATORY_CARE_PROVIDER_SITE_OTHER)
Admission: RE | Admit: 2017-02-13 | Discharge: 2017-02-13 | Disposition: A | Discharge status: Home / Self Care | Payer: BC Managed Care – PPO | Source: Ambulatory Visit | Attending: Family Medicine | Admitting: Family Medicine

## 2017-02-13 ENCOUNTER — Encounter: Payer: Self-pay | Admitting: Family Medicine

## 2017-02-13 ENCOUNTER — Ambulatory Visit (INDEPENDENT_AMBULATORY_CARE_PROVIDER_SITE_OTHER): Payer: BC Managed Care – PPO | Admitting: Family Medicine

## 2017-02-13 VITALS — BP 114/78 | HR 115 | Temp 98.4°F | Ht 59.75 in | Wt 189.0 lb

## 2017-02-13 DIAGNOSIS — M25572 Pain in left ankle and joints of left foot: Secondary | ICD-10-CM | POA: Diagnosis not present

## 2017-02-13 DIAGNOSIS — M25561 Pain in right knee: Secondary | ICD-10-CM | POA: Diagnosis not present

## 2017-02-13 DIAGNOSIS — G8929 Other chronic pain: Secondary | ICD-10-CM

## 2017-02-13 DIAGNOSIS — M7732 Calcaneal spur, left foot: Secondary | ICD-10-CM | POA: Diagnosis not present

## 2017-02-13 DIAGNOSIS — M25562 Pain in left knee: Secondary | ICD-10-CM | POA: Diagnosis not present

## 2017-02-13 NOTE — Progress Notes (Signed)
Dr. Karleen Hampshire T. Teena Mangus, MD, CAQ Sports [facility] [address] [place], [address] Phone: [phone] Fax: [phone]  02/13/2017  Patient: Tammie Wright, MRN: [id number], DOB: 03/31/1971, 46 y.o.  Primary Physician:  Karie Schwalbe, MD   Chief Complaint  Patient presents with  . Foot Pain    Left-Heel Spur & Arthritis in big toe  . Knee Pain    Left   Subjective:   Tammie Wright is a 46 y.o. very pleasant female patient who presents with the following:  Multiple MSK c/o:  Multiple prior ankle sprains, she has had multiple sprains over many years with both ankles treated in various ways including casting over the years.  Now her balance is somewhat affected and she has some increased laxity and has had repetitive ankle sprains.  3 1/2 weeks ago, turned her L ankle. Pain medially. WOrsenened with walking. She has not been doing anything with this.  L knee injured - then flared up. Right now does not particularly flared up or particularly painful, but there is some mild overall ache.  She also has question regarding a heel spur that was noted on x-ray.  This is not particularly painful though she does have occasional pain in the morning upon waking and when she stands on her feet.    Past Medical History, Surgical History, Social History, Family History, Problem List, Medications, and Allergies have been reviewed and updated if relevant.  Patient Active Problem List   Diagnosis Date Noted  . Obesity (BMI 30-39.9) 01/25/2017  . Left foot pain 11/21/2016  . Allergic rhinitis due to pollen   . Routine general medical examination at a health care facility 05/18/2011  . GERD (gastroesophageal reflux disease)   . IBS (irritable bowel syndrome)     Past Medical History:  Diagnosis Date  . Allergic rhinitis due to pollen   . Allergic rhinitis, cause unspecified   . Endometriosis   . GERD (gastroesophageal reflux disease)     . IBS (irritable bowel syndrome)     Past Surgical History:  Procedure Laterality Date  . COLONOSCOPY  9/14   and EGD--Dr Shelle Iron  . NASAL STENOSIS REPAIR  2007   Dr Willeen Cass  . OOPHORECTOMY Left 01/2014   and both tubes---for endometriosis  . TONSILLECTOMY  1999   had remnant removed 2010 by Dr Willeen Cass  . Uterine ablation  2014    Social History   Social History  . Marital status: Married    Spouse name: N/A  . Number of children: 0  . Years of education: N/A   Occupational History  . Patient accounts     [employer]   Social History Main Topics  . Smoking status: Never Smoker  . Smokeless tobacco: Never Used  . Alcohol use No  . Drug use: No  . Sexual activity: Not on file   Other Topics Concern  . Not on file   Social History Narrative  . No narrative on file    Family History  Problem Relation Age of Onset  . Cancer Father        bladder cancer  . Hypertension Maternal Grandmother   . Heart disease Maternal Grandfather   . Cancer Paternal Aunt        breast cancer  . Diabetes Neg Hx     No Known Allergies  Medication list reviewed and updated in full in [facility].  GEN: No fevers, chills. Nontoxic. Primarily  MSK c/o today. MSK: Detailed in the HPI GI: tolerating PO intake without difficulty Neuro: No numbness, parasthesias, or tingling associated. Otherwise the pertinent positives of the ROS are noted above.   Objective:   BP 114/78   Pulse (!) 115   Temp 98.4 F (36.9 C) (Oral)   Ht 4' 11.75" (1.518 m)   Wt 189 lb (85.7 kg)   LMP 01/24/2017   BMI 37.22 kg/m    GEN: Well-developed,well-nourished,in no acute distress; alert,appropriate and cooperative throughout examination HEENT: Normocephalic and atraumatic without obvious abnormalities. Ears, externally no deformities PULM: Breathing comfortably in no respiratory distress EXT: No clubbing, cyanosis, or edema PSYCH: Normally interactive. Cooperative during the interview. Pleasant.  Friendly and conversant. Not anxious or depressed appearing. Normal, full affect.  ANKLE: L Echymosis: no Edema: no ROM: Full dorsi and plantar flexion, inversion, eversion Gait: heel toe, non-antalgic Lateral Mall: NT Medial Mall: NT Talus: NT Navicular: NT Cuboid: NT Calcaneous: NT Metatarsals: NT 5th MT: NT Phalanges: NT Achilles: NT Plantar Fascia: NT Fat Pad: NT Peroneals: NT Post Tib: NT Great Toe: Nml motion Ant Drawer: neg Talar Tilt: neg ATFL: mild t CFL: NT Deltoid: tender Str: 5/5 Other Special tests: kleiger neg Sensation: intact   Knee:  b Gait: Normal heel toe pattern ROM: 0-125 Effusion: neg Echymosis or edema: none Patellar tendon NT Painful PLICA: neg Patellar grind: negative Medial and lateral patellar facet loading: negative medial and lateral joint lines:NT Mcmurray's neg Flexion-pinch neg Varus and valgus stress: stable Lachman: neg Ant and Post drawer: neg Hip abduction, IR, ER: WNL Hip flexion str: 5/5 Hip abd: 5/5 Quad: 5/5 VMO atrophy:No Hamstring concentric and eccentric: 5/5   Radiology: Dg Ankle Complete Left  Result Date: 02/13/2017 CLINICAL DATA:  Medial left ankle pain following trauma 3 weeks ago, initial encounter EXAM: LEFT ANKLE COMPLETE - 3+ VIEW COMPARISON:  None. FINDINGS: No acute fracture or dislocation is noted. Generalized soft tissue swelling is noted. Small calcaneal spurs noted. IMPRESSION: Soft tissue swelling without acute bony abnormality. Electronically Signed   By: Alcide CleverMark  Lukens M.D.   On: 02/13/2017 13:08     Assessment and Plan:   Acute left ankle pain - Plan: DG Ankle Complete Left  Chronic pain of both knees  Heel spur, left  >25 minutes spent in face to face time with patient, >50% spent in counselling or coordination of care   Tried to reassure the best I could.  Thing that she has a deltoid sprain that she is already primarily recovered from. I gave her an ASO ankle brace to use for added  stability, particularly when she needs to walk up a steep incline at work.  I would not do this any further than 2 or 3 weeks.  I reassured her about her knees, which are very benign today.  She likely has some early arthritis.  She may have some very mild plantar fasciitis, but I cannot provoke any symptoms today.  Heel spurs seen on x-ray without symptoms is of minimal importance, and I tried to reassure her the best I could.  Follow-up: prn only  Future Appointments Date Time Provider Department Center  01/31/2018 9:30 AM Karie SchwalbeLetvak, Richard I, MD [facility]   Orders Placed This Encounter  Procedures  . DG Ankle Complete Left    Signed,  Stratton Villwock T. Serenah Mill, MD   Patient's Medications  New Prescriptions   No medications on file  Previous Medications   CETIRIZINE (ZYRTEC) 10 MG TABLET    Take  10 mg by mouth daily.     DICYCLOMINE (BENTYL) 10 MG CAPSULE    Take 1 capsule (10 mg total) by mouth 3 (three) times daily as needed for spasms.   FEXOFENADINE (ALLEGRA) 180 MG TABLET    Take 180 mg by mouth daily.   FLUTICASONE (FLONASE) 50 MCG/ACT NASAL SPRAY    Place 1 spray into the nose daily.     MONTELUKAST (SINGULAIR) 10 MG TABLET    Take 1 tablet (10 mg total) by mouth daily.   PANTOPRAZOLE (PROTONIX) 40 MG TABLET    Take 1 tablet (40 mg total) by mouth daily.  Modified Medications   No medications on file  Discontinued Medications   No medications on file

## 2017-03-18 ENCOUNTER — Other Ambulatory Visit: Payer: Self-pay | Admitting: Obstetrics & Gynecology

## 2017-03-18 DIAGNOSIS — Z1231 Encounter for screening mammogram for malignant neoplasm of breast: Secondary | ICD-10-CM

## 2017-03-19 ENCOUNTER — Encounter: Payer: Self-pay | Admitting: Internal Medicine

## 2017-04-08 ENCOUNTER — Encounter: Payer: Self-pay | Admitting: Internal Medicine

## 2017-04-09 ENCOUNTER — Other Ambulatory Visit: Payer: Self-pay

## 2017-04-09 MED ORDER — DICYCLOMINE HCL 10 MG PO CAPS: 10.0000 mg | ORAL_CAPSULE | Freq: Three times a day (TID) | ORAL | 0 refills | Status: DC | PRN

## 2017-04-10 ENCOUNTER — Ambulatory Visit
Admission: RE | Admit: 2017-04-10 | Discharge: 2017-04-10 | Disposition: A | Discharge status: Home / Self Care | Payer: BC Managed Care – PPO | Source: Ambulatory Visit | Attending: Obstetrics & Gynecology | Admitting: Obstetrics & Gynecology

## 2017-04-10 DIAGNOSIS — Z1231 Encounter for screening mammogram for malignant neoplasm of breast: Secondary | ICD-10-CM | POA: Diagnosis present

## 2017-04-26 ENCOUNTER — Encounter: Payer: Self-pay | Admitting: Internal Medicine

## 2017-04-27 ENCOUNTER — Encounter: Payer: Self-pay | Admitting: Internal Medicine

## 2017-04-29 ENCOUNTER — Encounter: Payer: Self-pay | Admitting: Internal Medicine

## 2017-07-23 ENCOUNTER — Encounter: Payer: Self-pay | Admitting: Family Medicine

## 2018-01-14 ENCOUNTER — Encounter: Payer: Self-pay | Admitting: Internal Medicine

## 2018-01-15 ENCOUNTER — Other Ambulatory Visit: Payer: Self-pay | Admitting: Internal Medicine

## 2018-01-15 MED ORDER — PANTOPRAZOLE SODIUM 40 MG PO TBEC: 40.0000 mg | DELAYED_RELEASE_TABLET | Freq: Every day | ORAL | 1 refills | Status: DC

## 2018-01-31 ENCOUNTER — Encounter: Payer: BC Managed Care – PPO | Admitting: Internal Medicine

## 2018-03-21 ENCOUNTER — Other Ambulatory Visit: Payer: Self-pay | Admitting: Obstetrics & Gynecology

## 2018-03-21 DIAGNOSIS — Z1231 Encounter for screening mammogram for malignant neoplasm of breast: Secondary | ICD-10-CM

## 2018-04-15 ENCOUNTER — Ambulatory Visit
Admission: RE | Admit: 2018-04-15 | Discharge: 2018-04-15 | Disposition: A | Discharge status: Home / Self Care | Payer: BC Managed Care – PPO | Source: Ambulatory Visit | Attending: Obstetrics & Gynecology | Admitting: Obstetrics & Gynecology

## 2018-04-15 DIAGNOSIS — Z1231 Encounter for screening mammogram for malignant neoplasm of breast: Secondary | ICD-10-CM | POA: Diagnosis present

## 2018-04-16 ENCOUNTER — Ambulatory Visit: Payer: BC Managed Care – PPO

## 2018-05-06 ENCOUNTER — Encounter: Payer: BC Managed Care – PPO | Admitting: Internal Medicine

## 2018-05-06 ENCOUNTER — Encounter

## 2018-07-17 ENCOUNTER — Other Ambulatory Visit: Payer: Self-pay | Admitting: Internal Medicine

## 2018-09-03 ENCOUNTER — Encounter: Payer: BC Managed Care – PPO | Admitting: Internal Medicine

## 2018-10-16 MED ORDER — DICYCLOMINE HCL 10 MG PO CAPS: 10.0000 mg | ORAL_CAPSULE | Freq: Three times a day (TID) | ORAL | 0 refills | Status: DC | PRN

## 2018-10-16 MED ORDER — MONTELUKAST SODIUM 10 MG PO TABS: ORAL_TABLET | ORAL | 1 refills | Status: DC

## 2018-10-16 MED ORDER — PANTOPRAZOLE SODIUM 40 MG PO TBEC
DELAYED_RELEASE_TABLET | ORAL | 1 refills | Status: DC
Start: 2018-10-16 — End: 2019-04-20

## 2018-10-16 NOTE — Telephone Encounter (Signed)
Thanks. I sent in the refills for 6 months on the pantoprazole and montelukast. I will have Lyla Son call to set her a CPE for October 2020.

## 2018-10-16 NOTE — Telephone Encounter (Signed)
Tammie Wright, will you get her set up for a COE in October. You can reach out through MyChart this MyChart message. Thanks.

## 2018-10-16 NOTE — Telephone Encounter (Signed)
I really don't need to see her more than every other year at this point Set up PE for later in the year Refill the meds with enough till the appointment

## 2018-10-16 NOTE — Telephone Encounter (Signed)
She has not been seen in over 1.5 years. She has [employer]. Should we offer a VV CPE? How many refills should I give of the Protonix and Singulair? Please do the Bentyl. Thanks

## 2018-10-18 ENCOUNTER — Other Ambulatory Visit: Payer: Self-pay | Admitting: Internal Medicine

## 2018-10-21 ENCOUNTER — Other Ambulatory Visit: Payer: Self-pay | Admitting: Internal Medicine

## 2018-10-22 ENCOUNTER — Other Ambulatory Visit: Payer: Self-pay | Admitting: Internal Medicine

## 2019-02-13 ENCOUNTER — Ambulatory Visit (INDEPENDENT_AMBULATORY_CARE_PROVIDER_SITE_OTHER): Payer: BC Managed Care – PPO | Admitting: Internal Medicine

## 2019-02-13 ENCOUNTER — Other Ambulatory Visit: Payer: Self-pay

## 2019-02-13 ENCOUNTER — Encounter: Payer: Self-pay | Admitting: Internal Medicine

## 2019-02-13 VITALS — BP 134/86 | HR 110 | Temp 98.1°F | Ht 59.75 in | Wt 182.0 lb

## 2019-02-13 DIAGNOSIS — Z23 Encounter for immunization: Secondary | ICD-10-CM

## 2019-02-13 DIAGNOSIS — Z0000 Encounter for general adult medical examination without abnormal findings: Secondary | ICD-10-CM

## 2019-02-13 DIAGNOSIS — J301 Allergic rhinitis due to pollen: Secondary | ICD-10-CM

## 2019-02-13 DIAGNOSIS — K582 Mixed irritable bowel syndrome: Secondary | ICD-10-CM

## 2019-02-13 DIAGNOSIS — K219 Gastro-esophageal reflux disease without esophagitis: Secondary | ICD-10-CM

## 2019-02-13 LAB — CBC
HCT: 43.5 % (ref 36.0–46.0)
Hemoglobin: 14.4 g/dL (ref 12.0–15.0)
MCHC: 33.2 g/dL (ref 30.0–36.0)
MCV: 92.6 fl (ref 78.0–100.0)
Platelets: 270 10*3/uL (ref 150.0–400.0)
RBC: 4.69 Mil/uL (ref 3.87–5.11)
RDW: 12.8 % (ref 11.5–15.5)
WBC: 6.2 10*3/uL (ref 4.0–10.5)

## 2019-02-13 LAB — COMPREHENSIVE METABOLIC PANEL
ALT: 24 U/L (ref 0–35)
AST: 19 U/L (ref 0–37)
Albumin: 4.5 g/dL (ref 3.5–5.2)
Alkaline Phosphatase: 58 U/L (ref 39–117)
BUN: 11 mg/dL (ref 6–23)
CO2: 30 mEq/L (ref 19–32)
Calcium: 9.5 mg/dL (ref 8.4–10.5)
Chloride: 104 mEq/L (ref 96–112)
Creatinine, Ser: 0.75 mg/dL (ref 0.40–1.20)
GFR: 83.06 mL/min (ref >60.00)
Glucose, Bld: 100 mg/dL — ABNORMAL HIGH (ref 70–99)
Potassium: 4.3 mEq/L (ref 3.5–5.1)
Sodium: 140 mEq/L (ref 135–145)
Total Bilirubin: 0.3 mg/dL (ref 0.2–1.2)
Total Protein: 7.7 g/dL (ref 6.0–8.3)

## 2019-02-13 NOTE — Assessment & Plan Note (Signed)
Hasn't needed the bentyl lately

## 2019-02-13 NOTE — Progress Notes (Signed)
Subjective:    Patient ID: Tammie Wright, female    DOB: [birth year], 48 y.o.   MRN: [id number]  HPI Here for physical  Doing okay Working at home on [address] socially distant No new concerns  Has gone 3 months without a cycle Some hot flashes--not bad enough to consider Rx  Satisfied with allergy treatment for the most part--but frustrated by need to take so much  Current Outpatient Medications on File Prior to Visit  Medication Sig Dispense Refill  . cetirizine (ZYRTEC) 10 MG tablet Take 10 mg by mouth daily.      Marland Kitchen dicyclomine (BENTYL) 10 MG capsule Take 1 capsule (10 mg total) by mouth 3 (three) times daily as needed for spasms. 60 capsule 0  . fexofenadine (ALLEGRA) 180 MG tablet Take 180 mg by mouth daily.    . fluticasone (FLONASE) 50 MCG/ACT nasal spray Place 1 spray into the nose daily.      . montelukast (SINGULAIR) 10 MG tablet TAKE 1 TABLET(10 MG) BY MOUTH DAILY 90 tablet 1  . pantoprazole (PROTONIX) 40 MG tablet TAKE 1 TABLET(40 MG) BY MOUTH DAILY 90 tablet 1   No current facility-administered medications on file prior to visit.     No Known Allergies  Past Medical History:  Diagnosis Date  . Allergic rhinitis due to pollen   . Allergic rhinitis, cause unspecified   . Endometriosis   . GERD (gastroesophageal reflux disease)   . IBS (irritable bowel syndrome)     Past Surgical History:  Procedure Laterality Date  . COLONOSCOPY  9/14   and EGD--Dr Rayann Heman  . NASAL STENOSIS REPAIR  2007   Dr Richardson Landry  . OOPHORECTOMY Left 01/2014   and both tubes---for endometriosis  . TONSILLECTOMY  1999   had remnant removed 2010 by Dr Richardson Landry  . Uterine ablation  2014    Family History  Problem Relation Age of Onset  . Cancer Father        bladder cancer  . Hypertension Maternal Grandmother   . Heart disease Maternal Grandfather   . Cancer Paternal Aunt        breast cancer  . Breast cancer Paternal Aunt   . Diabetes Neg Hx     Social History    Socioeconomic History  . Marital status: Married    Spouse name: Not on file  . Number of children: 0  . Years of education: Not on file  . Highest education level: Not on file  Occupational History  . Occupation: Patient accounts    Comment: [employer]  Social Needs  . Financial resource strain: Not on file  . Food insecurity    Worry: Not on file    Inability: Not on file  . Transportation needs    Medical: Not on file    Non-medical: Not on file  Tobacco Use  . Smoking status: Never Smoker  . Smokeless tobacco: Never Used  Substance and Sexual Activity  . Alcohol use: No  . Drug use: No  . Sexual activity: Not on file  Lifestyle  . Physical activity    Days per week: Not on file    Minutes per session: Not on file  . Stress: Not on file  Relationships  . Social Herbalist on phone: Not on file    Gets together: Not on file    Attends religious service: Not on file    Active member of club or organization: Not on file  Attends meetings of clubs or organizations: Not on file    Relationship status: Not on file  . Intimate partner violence    Fear of current or ex partner: Not on file    Emotionally abused: Not on file    Physically abused: Not on file    Forced sexual activity: Not on file  Other Topics Concern  . Not on file  Social History Narrative  . Not on file   Review of Systems  Constitutional:       Has lost a few pounds---improved eating and more exercise (walking) Wears seat belt  HENT: Negative for hearing loss, tinnitus and trouble swallowing.        Keeps up with dentist---just had chip in tooth  Eyes: Negative for visual disturbance.       No diplopia or unilateral vision loss  Respiratory: Negative for cough, chest tightness and shortness of breath.   Cardiovascular: Negative for chest pain, palpitations and leg swelling.  Gastrointestinal: Negative for blood in stool.       Some IBS--discomfort and bowel changes--no recent bentyl  needed No heartburn on the medication  Endocrine: Negative for polydipsia and polyuria.  Genitourinary: Negative for difficulty urinating, dyspareunia, dysuria and hematuria.  Musculoskeletal: Positive for back pain. Negative for arthralgias and joint swelling.       Sees chiropractor--past sciatica  Skin: Negative for rash.  Allergic/Immunologic: Positive for environmental allergies. Negative for immunocompromised state.  Neurological: Negative for dizziness, syncope, light-headedness and headaches.  Hematological: Negative for adenopathy. Does not bruise/bleed easily.  Psychiatric/Behavioral: Negative for dysphoric mood and sleep disturbance. The patient is not nervous/anxious.        Objective:   Physical Exam  Constitutional: She is oriented to person, place, and time. She appears well-developed. No distress.  HENT:  Head: Normocephalic and atraumatic.  Right Ear: External ear normal.  Left Ear: External ear normal.  Mouth/Throat: Oropharynx is clear and moist. No oropharyngeal exudate.  Eyes: Pupils are equal, round, and reactive to light. Conjunctivae are normal.  Neck: No thyromegaly present.  Cardiovascular: Normal rate, regular rhythm, normal heart sounds and intact distal pulses. Exam reveals no gallop.  No murmur heard. Respiratory: Effort normal and breath sounds normal. No respiratory distress. She has no wheezes. She has no rales.  GI: Soft. There is no abdominal tenderness.  Musculoskeletal:        General: No tenderness or edema.  Lymphadenopathy:    She has no cervical adenopathy.  Neurological: She is alert and oriented to person, place, and time.  Skin: No rash noted. No erythema.  Psychiatric: She has a normal mood and affect. Her behavior is normal.           Assessment & Plan:

## 2019-02-13 NOTE — Assessment & Plan Note (Signed)
Quiet on the medication Discussed trying to wean some

## 2019-02-13 NOTE — Assessment & Plan Note (Signed)
Healthy Working on fitness---has lost some weight Yearly mammogram due to [place] (aunt) Colon screening at 76 Flu vaccine given

## 2019-02-13 NOTE — Assessment & Plan Note (Signed)
Okay on her regimen

## 2019-03-04 ENCOUNTER — Telehealth: Payer: Self-pay | Admitting: Internal Medicine

## 2019-03-04 ENCOUNTER — Other Ambulatory Visit: Payer: Self-pay | Admitting: Obstetrics & Gynecology

## 2019-03-04 DIAGNOSIS — Z1231 Encounter for screening mammogram for malignant neoplasm of breast: Secondary | ICD-10-CM

## 2019-03-04 NOTE — Telephone Encounter (Signed)
Spoke to pt. Advised her that her vaccines are located in [employer] under the Immunization tab. She will copy that and send it.  She also asked about switching up Zyrtec for Xyzal because her allergies are really bad right now. I told her that should be fine and I would document that in case she calls back with any issues.

## 2019-03-04 NOTE — Telephone Encounter (Signed)
Patient had her flu shot on 02/13/19.  Patient needs documentation that she had the flu shot for work.  Patient asked if it can be sent through my chart.  If it can't be sent through my chart, mail to patient.

## 2019-04-20 MED ORDER — PANTOPRAZOLE SODIUM 40 MG PO TBEC: DELAYED_RELEASE_TABLET | ORAL | 3 refills | Status: DC

## 2019-04-20 MED ORDER — MONTELUKAST SODIUM 10 MG PO TABS: ORAL_TABLET | ORAL | 3 refills | Status: DC

## 2019-04-23 ENCOUNTER — Ambulatory Visit
Admission: RE | Admit: 2019-04-23 | Discharge: 2019-04-23 | Disposition: A | Discharge status: Home / Self Care | Payer: BC Managed Care – PPO | Source: Ambulatory Visit | Attending: Obstetrics & Gynecology | Admitting: Obstetrics & Gynecology

## 2019-04-23 ENCOUNTER — Other Ambulatory Visit: Payer: Self-pay

## 2019-04-23 DIAGNOSIS — Z1231 Encounter for screening mammogram for malignant neoplasm of breast: Secondary | ICD-10-CM | POA: Insufficient documentation

## 2020-01-08 MED ORDER — DICYCLOMINE HCL 10 MG PO CAPS: 10.0000 mg | ORAL_CAPSULE | Freq: Three times a day (TID) | ORAL | 0 refills | Status: DC | PRN

## 2020-02-16 ENCOUNTER — Encounter: Payer: Self-pay | Admitting: Internal Medicine

## 2020-02-16 ENCOUNTER — Ambulatory Visit (INDEPENDENT_AMBULATORY_CARE_PROVIDER_SITE_OTHER): Payer: BC Managed Care – PPO | Admitting: Internal Medicine

## 2020-02-16 ENCOUNTER — Other Ambulatory Visit: Payer: Self-pay

## 2020-02-16 VITALS — BP 132/84 | HR 90 | Temp 98.2°F | Ht 59.75 in | Wt 180.8 lb

## 2020-02-16 DIAGNOSIS — J301 Allergic rhinitis due to pollen: Secondary | ICD-10-CM | POA: Diagnosis not present

## 2020-02-16 DIAGNOSIS — K219 Gastro-esophageal reflux disease without esophagitis: Secondary | ICD-10-CM | POA: Diagnosis not present

## 2020-02-16 DIAGNOSIS — Z0000 Encounter for general adult medical examination without abnormal findings: Secondary | ICD-10-CM | POA: Diagnosis not present

## 2020-02-16 DIAGNOSIS — K582 Mixed irritable bowel syndrome: Secondary | ICD-10-CM

## 2020-02-16 LAB — CBC
HCT: 41.8 % (ref 36.0–46.0)
Hemoglobin: 14.1 g/dL (ref 12.0–15.0)
MCHC: 33.6 g/dL (ref 30.0–36.0)
MCV: 93.1 fl (ref 78.0–100.0)
Platelets: 282 10*3/uL (ref 150.0–400.0)
RBC: 4.5 Mil/uL (ref 3.87–5.11)
RDW: 12.6 % (ref 11.5–15.5)
WBC: 6 10*3/uL (ref 4.0–10.5)

## 2020-02-16 LAB — COMPREHENSIVE METABOLIC PANEL
ALT: 19 U/L (ref 0–35)
AST: 13 U/L (ref 0–37)
Albumin: 4.5 g/dL (ref 3.5–5.2)
Alkaline Phosphatase: 66 U/L (ref 39–117)
BUN: 11 mg/dL (ref 6–23)
CO2: 32 mEq/L (ref 19–32)
Calcium: 9.7 mg/dL (ref 8.4–10.5)
Chloride: 101 mEq/L (ref 96–112)
Creatinine, Ser: 0.83 mg/dL (ref 0.40–1.20)
GFR: 73.57 mL/min (ref >60.00)
Glucose, Bld: 88 mg/dL (ref 70–99)
Potassium: 4.3 mEq/L (ref 3.5–5.1)
Sodium: 140 mEq/L (ref 135–145)
Total Bilirubin: 0.6 mg/dL (ref 0.2–1.2)
Total Protein: 7.7 g/dL (ref 6.0–8.3)

## 2020-02-16 NOTE — Assessment & Plan Note (Signed)
Doing okay with her regimen 

## 2020-02-16 NOTE — Assessment & Plan Note (Signed)
Rare bentyl

## 2020-02-16 NOTE — Progress Notes (Signed)
Subjective:    Patient ID: Tammie Wright, female    DOB: 05-Jun-1971, 49 y.o.   MRN: [id number]  HPI Here for physical This visit occurred during the SARS-CoV-2 public health emergency.  Safety protocols were in place, including screening questions prior to the visit, additional usage of staff PPE, and extensive cleaning of exam room while observing appropriate contact time as indicated for disinfecting solutions.   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 B Complex-C-Folic Acid (STRESS B COMPLEX PO) Take by mouth.    . dicyclomine (BENTYL) 10 MG capsule Take 1 capsule (10 mg total) by mouth 3 (three) times daily as needed for spasms. 60 capsule 0  . fexofenadine (ALLEGRA) 180 MG tablet Take 180 mg by mouth daily.    . fluticasone (FLONASE) 50 MCG/ACT nasal spray Place 1 spray into the nose daily.      Marland Kitchen levocetirizine (XYZAL) 5 MG tablet Take 5 mg by mouth every evening.    . montelukast (SINGULAIR) 10 MG tablet TAKE 1 TABLET(10 MG) BY MOUTH DAILY 90 tablet 3  . pantoprazole (PROTONIX) 40 MG tablet TAKE 1 TABLET(40 MG) BY MOUTH DAILY 90 tablet 3  . cetirizine (ZYRTEC) 10 MG tablet Take 10 mg by mouth daily.   (Patient not taking: Reported on 02/16/2020)     No current facility-administered medications on file prior to visit.    No Known Allergies  Past Medical History:  Diagnosis Date  . Allergic rhinitis due to pollen   . Allergic rhinitis, cause unspecified   . Endometriosis   . GERD (gastroesophageal reflux disease)   . IBS (irritable bowel syndrome)     Past  Surgical History:  Procedure Laterality Date  . COLONOSCOPY  9/14   and EGD--Dr Shelle Iron  . NASAL STENOSIS REPAIR  2007   Dr Willeen Cass  . OOPHORECTOMY Left 01/2014   and both tubes---for endometriosis  . TONSILLECTOMY  1999   had remnant removed 2010 by Dr Willeen Cass  . Uterine ablation  2013    Family History  Problem Relation Age of Onset  . Cancer Father        bladder cancer  . Hypertension Maternal Grandmother   . Hypothyroidism Maternal Grandmother   . Heart disease Maternal Grandfather   . Cancer Paternal Aunt        breast cancer  . Breast cancer Paternal Aunt   . Diabetes Neg Hx     Social History   Socioeconomic History  . Marital status: Married    Spouse name: Not on file  . Number of children: 0  . Years of education: Not on file  . Highest education level: Not on file  Occupational History  . Occupation: Patient accounts    Comment: [employer]  Tobacco Use  . Smoking status: Never Smoker  . Smokeless tobacco: Never Used  Substance and Sexual Activity  . Alcohol use: No  . Drug use: No  . Sexual activity: Not on file  Other Topics Concern  . Not on file  Social History Narrative  . Not on file   Social  Determinants of Health   Financial Resource Strain:   . Difficulty of Paying Living Expenses: Not on file  Food Insecurity:   . Worried About Programme researcher, broadcasting/film/video in the Last Year: Not on file  . Ran Out of Food in the Last Year: Not on file  Transportation Needs:   . Lack of Transportation (Medical): Not on file  . Lack of Transportation (Non-Medical): Not on file  Physical Activity:   . Days of Exercise per Week: Not on file  . Minutes of Exercise per Session: Not on file  Stress:   . Feeling of Stress : Not on file  Social Connections:   . Frequency of Communication with Friends and Family: Not on file  . Frequency of Social Gatherings with Friends and Family: Not on file  . Attends Religious Services: Not on file  . Active Member of Clubs or  Organizations: Not on file  . Attends Banker Meetings: Not on file  . Marital Status: Not on file  Intimate Partner Violence:   . Fear of Current or Ex-Partner: Not on file  . Emotionally Abused: Not on file  . Physically Abused: Not on file  . Sexually Abused: Not on file   Review of Systems  Constitutional: Negative for fatigue.       Weight down slightly Wears seat belt  HENT: Negative for dental problem, hearing loss, tinnitus and trouble swallowing.        Keeps up with dentist  Eyes: Negative for visual disturbance.       No diplopia or unilateral vision loss  Respiratory: Negative for chest tightness and shortness of breath.        Occ cough from PND  Cardiovascular: Negative for chest pain, palpitations and leg swelling.  Gastrointestinal: Negative for blood in stool.       Some diarrhea/constipation with IBS  Endocrine: Negative for polydipsia and polyuria.  Genitourinary: Negative for dyspareunia, dysuria and hematuria.       Periods got more regular again after missing 5 months at beginning of the year--but now missed again  Musculoskeletal: Negative for back pain and joint swelling.       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       No suspicious lesions  Allergic/Immunologic: Positive for environmental allergies. Negative for immunocompromised state.  Neurological: Negative for dizziness, syncope and light-headedness.       Occ sinus headaches  Hematological: Negative for adenopathy. Does not bruise/bleed easily.  Psychiatric/Behavioral: Negative for dysphoric mood and sleep disturbance. The patient is not nervous/anxious.        Objective:   Physical Exam Constitutional:      Appearance: Normal appearance.  HENT:     Right Ear: Tympanic membrane, ear canal and external ear normal.     Left Ear: Tympanic membrane, ear canal and external ear normal.     Mouth/Throat:     Pharynx: No oropharyngeal exudate  or posterior oropharyngeal erythema.  Eyes:     Conjunctiva/sclera: Conjunctivae normal.     Pupils: Pupils are equal, round, and reactive to light.  Cardiovascular:     Rate and Rhythm: Normal rate and regular rhythm.     Pulses: Normal pulses.     Heart sounds: No murmur heard.  No gallop.   Pulmonary:     Effort: Pulmonary effort is normal.     Breath sounds: Normal breath sounds. No wheezing or rales.  Abdominal:  Palpations: Abdomen is soft.     Tenderness: There is no abdominal tenderness.  Musculoskeletal:     Cervical back: Neck supple.     Right lower leg: No edema.     Left lower leg: No edema.  Lymphadenopathy:     Cervical: No cervical adenopathy.  Skin:    General: Skin is warm.     Findings: No rash.  Neurological:     General: No focal deficit present.     Mental Status: She is alert and oriented to person, place, and time.  Psychiatric:        Mood and Affect: Mood normal.        Behavior: Behavior normal.            Assessment & Plan:

## 2020-02-16 NOTE — Assessment & Plan Note (Signed)
Healthy Working out and has been gradually losing weight Yearly mammogram--- due in November Normal colon in 2014----due 11/24 (just hyperplastic polyp) Had first COVID---will get 2nd and flu vaccine soon Recent pap smear

## 2020-02-16 NOTE — Assessment & Plan Note (Signed)
Symptoms controlled but didn't tolerate wean of PPI---keep daily

## 2020-02-19 ENCOUNTER — Encounter: Payer: BC Managed Care – PPO | Admitting: Internal Medicine

## 2020-04-11 ENCOUNTER — Other Ambulatory Visit: Payer: Self-pay | Admitting: Obstetrics & Gynecology

## 2020-04-11 DIAGNOSIS — Z1231 Encounter for screening mammogram for malignant neoplasm of breast: Secondary | ICD-10-CM

## 2020-04-28 ENCOUNTER — Ambulatory Visit
Admission: RE | Admit: 2020-04-28 | Discharge: 2020-04-28 | Disposition: A | Discharge status: Home / Self Care | Payer: BC Managed Care – PPO | Source: Ambulatory Visit | Attending: Obstetrics & Gynecology | Admitting: Obstetrics & Gynecology

## 2020-04-28 ENCOUNTER — Other Ambulatory Visit: Payer: Self-pay

## 2020-04-28 DIAGNOSIS — Z1231 Encounter for screening mammogram for malignant neoplasm of breast: Secondary | ICD-10-CM | POA: Diagnosis present

## 2020-04-28 MED ORDER — MONTELUKAST SODIUM 10 MG PO TABS
ORAL_TABLET | ORAL | 3 refills | Status: DC
Start: 2020-04-28 — End: 2021-05-01

## 2020-04-28 MED ORDER — PANTOPRAZOLE SODIUM 40 MG PO TBEC
DELAYED_RELEASE_TABLET | ORAL | 3 refills | Status: DC
Start: 2020-04-28 — End: 2021-05-01

## 2020-09-05 MED ORDER — DICYCLOMINE HCL 10 MG PO CAPS: 10.0000 mg | ORAL_CAPSULE | Freq: Three times a day (TID) | ORAL | 0 refills | Status: DC | PRN

## 2020-10-31 ENCOUNTER — Telehealth: Payer: BC Managed Care – PPO | Admitting: Adult Health

## 2020-10-31 ENCOUNTER — Telehealth: Payer: Self-pay

## 2020-10-31 ENCOUNTER — Encounter: Payer: Self-pay | Admitting: Adult Health

## 2020-10-31 VITALS — Wt 168.7 lb

## 2020-10-31 DIAGNOSIS — Z20828 Contact with and (suspected) exposure to other viral communicable diseases: Secondary | ICD-10-CM | POA: Diagnosis not present

## 2020-10-31 DIAGNOSIS — J069 Acute upper respiratory infection, unspecified: Secondary | ICD-10-CM

## 2020-10-31 NOTE — Patient Instructions (Signed)

## 2020-10-31 NOTE — Progress Notes (Signed)
Virtual Visit via Video Note  I connected with Tammie Wright on 10/31/20 at  3:00 PM EDT by a video enabled telemedicine application and verified that I am speaking with the correct person using two identifiers.   Parties involved in visit as below:   Location: Patient: at home  Provider: Provider: Provider's office at  [facility], [place].      I discussed the limitations of evaluation and management by telemedicine and the availability of in person appointments. The patient expressed understanding and agreed to proceed.  History of Present Illness: Patient reports symptoms of productive cough, since Saturday 10/29/20 and started with productive cough at times yellow/ white. Steam shower helps with cough. Highest temperature is 100 oral. Today 99.9 temperature.  She did take regular tylenol PRN- took at 1 pm today when noticed fever.   Post nasal drip.   Denies any recent illness.  She was exposed to influenza at her ladies conference was right in front of her. She reports this person was tested.   History of tonsillectomy.years ago.  Patient  denies any chills, rash, chest pain, shortness of breath, nausea, vomiting, or diarrhea.  Denies dizziness, lightheadedness, pre syncopal or syncopal episodes.   Observations/Objective:   Patient is alert and oriented and responsive to questions Engages in conversation with provider. Speaks in full sentences without any pauses without any shortness of breath or distress.  Assessment and Plan:  1. Viral upper respiratory tract infection Onset of symptoms 2- 3 days ago, symptoms reported as mild.  - COVID-19, Flu A+B and RSV; Future Over the counter symptomatic management is advised.  2. Exposure to influenza Recent c;lose contact with someone who recently has tested negative for influenza.  - COVID-19, Flu A+B and RSV; Future Follow Up Instructions: Return in about 3 days (around 11/03/2020), or if  symptoms worsen or fail to improve, for at any time for any worsening symptoms, Go to Emergency room/ urgent care if worse. Advised in person evaluation at anytime is advised if any symptoms do not improve, worsen or change at any given time.  Red Flags discussed. The patient was given clear instructions to go to ER or return to medical center if any red flags develop, symptoms do not improve, worsen or new problems develop. They verbalized understanding.    I discussed the assessment and treatment plan with the patient. The patient was provided an opportunity to ask questions and all were answered. The patient agreed with the plan and demonstrated an understanding of the instructions.   The patient was advised to call back or seek an in-person evaluation if the symptoms worsen or if the condition fails to improve as anticipated.   Jairo Ben, FNP

## 2020-10-31 NOTE — Telephone Encounter (Signed)
Called Tammie Wright to schedule her for a covid test on 11/01/2020. Patient returned the call and scheduled for 2:45pm on 11/01/2020

## 2020-11-01 ENCOUNTER — Other Ambulatory Visit: Payer: BC Managed Care – PPO

## 2020-11-01 ENCOUNTER — Telehealth: Payer: Self-pay

## 2020-11-01 DIAGNOSIS — Z20828 Contact with and (suspected) exposure to other viral communicable diseases: Secondary | ICD-10-CM

## 2020-11-01 DIAGNOSIS — J069 Acute upper respiratory infection, unspecified: Secondary | ICD-10-CM

## 2020-11-01 NOTE — Telephone Encounter (Signed)
Called to remind patient to stay in her vehicle and contact [phone] once she arrives at the office to check in for her covid swab.

## 2020-11-02 LAB — COVID-19, FLU A+B AND RSV
Influenza A, NAA: DETECTED — AB
Influenza B, NAA: NOT DETECTED
RSV, NAA: NOT DETECTED
SARS-CoV-2, NAA: NOT DETECTED

## 2020-11-02 NOTE — Progress Notes (Signed)
Flu A is positive.  Flu B, RSV, and covid negative.

## 2020-11-06 NOTE — Progress Notes (Signed)
Patient was not interested in antivirals at time of visit, we discussed symptom management with over the counter medication at that time per package. She had known exposure to Influenza A at time of visit- close contact. Return to clinic if not improving or worsening at anytime was discussed with patient or being seen in person.

## 2020-11-07 ENCOUNTER — Encounter: Payer: Self-pay | Admitting: Adult Health

## 2020-12-20 ENCOUNTER — Telehealth: Payer: Self-pay | Admitting: Internal Medicine

## 2020-12-20 NOTE — Telephone Encounter (Signed)
Pain in LLQ started a few days ago. More painful yesterday. 1 week ago had diarrhea for a day. Nauseous today. Has never had diverticulitis bt someone suggested that. Has IBS. Made appt for tomorrow at 745.

## 2020-12-20 NOTE — Telephone Encounter (Signed)
Mrs. Hollars called in wanted to know if Tammie Wright could call her she thinks she has possible diverticulitis and the pain worse when she sit down and she stated that the pain moved.

## 2020-12-21 ENCOUNTER — Other Ambulatory Visit: Payer: Self-pay

## 2020-12-21 ENCOUNTER — Encounter: Payer: Self-pay | Admitting: Internal Medicine

## 2020-12-21 ENCOUNTER — Ambulatory Visit: Payer: BC Managed Care – PPO | Admitting: Internal Medicine

## 2020-12-21 VITALS — BP 122/78 | HR 93 | Temp 97.5°F | Ht 59.5 in | Wt 168.0 lb

## 2020-12-21 DIAGNOSIS — R1032 Left lower quadrant pain: Secondary | ICD-10-CM | POA: Insufficient documentation

## 2020-12-21 LAB — POC URINALSYSI DIPSTICK (AUTOMATED)
Glucose, UA: NEGATIVE
Leukocytes, UA: NEGATIVE
Nitrite, UA: NEGATIVE
Protein, UA: POSITIVE — AB
Spec Grav, UA: >=1.03 — AB (ref 1.010–1.025)
Urobilinogen, UA: 0.2 E.U./dL
pH, UA: 6 (ref 5.0–8.0)

## 2020-12-21 NOTE — Assessment & Plan Note (Addendum)
With change in bowel habits---different than her past IBS No urinary symptoms but will check urine---negative for leuks, nitrites. Trace blood Young for diverticular disease--but possible Pain could be uterine or ovarian (though no left ovary now--but had referred pain in the past) Doubt IBD---but will likely need colonoscopy to be sure no intrinsic bowel pathology if nothing on imaging Discussed options---but CT is best single test for this situation GI referral as well

## 2020-12-21 NOTE — Progress Notes (Signed)
Subjective:    Patient ID: Tammie Wright, female    DOB: 03/27/1971, 50 y.o.   MRN: [id number]  HPI Here due to abdominal pain This visit occurred during the SARS-CoV-2 public health emergency.  Safety protocols were in place, including screening questions prior to the visit, additional usage of staff PPE, and extensive cleaning of exam room while observing appropriate contact time as indicated for disinfecting solutions.   Known irritable bowel "but more irritable lately" 2 days ago--more soreness in LLQ Had to get up and move around  2 weeks ago--had weird bowel stuff going on Another episode of other pain Now feels it LLQ still not right and can feel it in RLQ No blood in stool  Known endometriosis--but no cycle in 2 months  Current Outpatient Medications on File Prior to Visit  Medication Sig Dispense Refill   B Complex-C-Folic Acid (STRESS B COMPLEX PO) Take by mouth.     dicyclomine (BENTYL) 10 MG capsule Take 1 capsule (10 mg total) by mouth 3 (three) times daily as needed for spasms. 60 capsule 0   fexofenadine (ALLEGRA) 180 MG tablet Take 180 mg by mouth daily.     fluticasone (FLONASE) 50 MCG/ACT nasal spray Place 1 spray into the nose daily.     levocetirizine (XYZAL) 5 MG tablet Take 5 mg by mouth every evening.     montelukast (SINGULAIR) 10 MG tablet TAKE 1 TABLET(10 MG) BY MOUTH DAILY 90 tablet 3   pantoprazole (PROTONIX) 40 MG tablet TAKE 1 TABLET(40 MG) BY MOUTH DAILY 90 tablet 3   No current facility-administered medications on file prior to visit.    No Known Allergies  Past Medical History:  Diagnosis Date   Allergic rhinitis due to pollen    Allergic rhinitis, cause unspecified    Endometriosis    GERD (gastroesophageal reflux disease)    IBS (irritable bowel syndrome)     Past Surgical History:  Procedure Laterality Date   COLONOSCOPY  9/14   and EGD--Dr [facility]   NASAL STENOSIS REPAIR  2007   Dr Willeen Cass   OOPHORECTOMY Left 01/2014   and both  tubes---for endometriosis   TONSILLECTOMY  1999   had remnant removed 2010 by Dr Willeen Cass   Uterine ablation  2013    Family History  Problem Relation Age of Onset   Cancer Father        bladder cancer   Hypertension Maternal Grandmother    Hypothyroidism Maternal Grandmother    Heart disease Maternal Grandfather    Cancer Paternal Aunt        breast cancer   Breast cancer Paternal Aunt    Diabetes Neg Hx     Social History   Socioeconomic History   Marital status: Married    Spouse name: Not on file   Number of children: 0   Years of education: Not on file   Highest education level: Not on file  Occupational History   Occupation: Patient accounts    Comment: [employer]  Tobacco Use   Smoking status: Never   Smokeless tobacco: Never  Substance and Sexual Activity   Alcohol use: No   Drug use: No   Sexual activity: Not on file  Other Topics Concern   Not on file  Social History Narrative   Not on file   Social Determinants of Health   Financial Resource Strain: Not on file  Food Insecurity: Not on file  Transportation Needs: Not on file  Physical Activity: Not on  file  Stress: Not on file  Social Connections: Not on file  Intimate Partner Violence: Not on file   Review of Systems Appetite is fine Has lost some weight--trying to  No dysuria or hematuria No recent sex---doesn't remember dyspareunia    Objective:   Physical Exam Constitutional:      Appearance: Normal appearance.  Cardiovascular:     Rate and Rhythm: Normal rate and regular rhythm.     Heart sounds: No murmur heard.   No gallop.  Pulmonary:     Effort: Pulmonary effort is normal.     Breath sounds: Normal breath sounds. No wheezing or rales.  Abdominal:     General: Bowel sounds are normal.     Palpations: Abdomen is soft.     Tenderness: There is no guarding or rebound.     Comments: Mild LLQ tenderness  Musculoskeletal:     Cervical back: Neck supple.  Lymphadenopathy:     Cervical:  No cervical adenopathy.  Neurological:     Mental Status: She is alert.           Assessment & Plan:

## 2020-12-21 NOTE — Addendum Note (Signed)
Addended by: Eual Fines on: 12/21/2020 08:29 AM   Modules accepted: Orders

## 2020-12-29 ENCOUNTER — Ambulatory Visit
Admission: RE | Admit: 2020-12-29 | Discharge: 2020-12-29 | Disposition: A | Discharge status: Home / Self Care | Payer: BC Managed Care – PPO | Source: Ambulatory Visit | Attending: Internal Medicine | Admitting: Internal Medicine

## 2020-12-29 DIAGNOSIS — R1032 Left lower quadrant pain: Secondary | ICD-10-CM | POA: Diagnosis not present

## 2020-12-29 MED ORDER — IOHEXOL 350 MG/ML SOLN
100.0000 mL | Freq: Once | INTRAVENOUS | Status: AC | PRN
  Administered 2020-12-29: 100 mL via INTRAVENOUS

## 2021-01-04 ENCOUNTER — Telehealth: Payer: Self-pay | Admitting: Internal Medicine

## 2021-01-04 NOTE — Telephone Encounter (Signed)
Good afternoon Dr. Leone Payor, we received a referral for patient to be seen for LLQ abdominal pain.  She saw you in 2007 then went to [facility] but wants to tranfer back to [place] since all her other care is with [facility].  Records are in Epic.  Can you please review and advise on scheduling?  Thank you.

## 2021-01-10 NOTE — Telephone Encounter (Signed)
OK to schedule with me or an APP Next available new

## 2021-01-11 ENCOUNTER — Encounter: Payer: Self-pay | Admitting: Physician Assistant

## 2021-01-11 NOTE — Telephone Encounter (Signed)
Called patient and left voice mail to call back to schedule an appt.

## 2021-02-10 ENCOUNTER — Ambulatory Visit: Payer: BC Managed Care – PPO | Admitting: Physician Assistant

## 2021-02-10 ENCOUNTER — Other Ambulatory Visit (INDEPENDENT_AMBULATORY_CARE_PROVIDER_SITE_OTHER): Payer: BC Managed Care – PPO

## 2021-02-10 ENCOUNTER — Encounter: Payer: Self-pay | Admitting: Physician Assistant

## 2021-02-10 VITALS — BP 150/94 | HR 92 | Ht 59.5 in | Wt 171.1 lb

## 2021-02-10 DIAGNOSIS — R1084 Generalized abdominal pain: Secondary | ICD-10-CM | POA: Diagnosis not present

## 2021-02-10 DIAGNOSIS — R110 Nausea: Secondary | ICD-10-CM

## 2021-02-10 DIAGNOSIS — R194 Change in bowel habit: Secondary | ICD-10-CM | POA: Diagnosis not present

## 2021-02-10 DIAGNOSIS — K219 Gastro-esophageal reflux disease without esophagitis: Secondary | ICD-10-CM

## 2021-02-10 LAB — COMPREHENSIVE METABOLIC PANEL
ALT: 15 U/L (ref 0–35)
AST: 11 U/L (ref 0–37)
Albumin: 4.3 g/dL (ref 3.5–5.2)
Alkaline Phosphatase: 65 U/L (ref 39–117)
BUN: 12 mg/dL (ref 6–23)
CO2: 30 mEq/L (ref 19–32)
Calcium: 9.2 mg/dL (ref 8.4–10.5)
Chloride: 102 mEq/L (ref 96–112)
Creatinine, Ser: 0.77 mg/dL (ref 0.40–1.20)
GFR: 91.26 mL/min (ref >60.00)
Glucose, Bld: 80 mg/dL (ref 70–99)
Potassium: 3.7 mEq/L (ref 3.5–5.1)
Sodium: 141 mEq/L (ref 135–145)
Total Bilirubin: 0.4 mg/dL (ref 0.2–1.2)
Total Protein: 7.6 g/dL (ref 6.0–8.3)

## 2021-02-10 LAB — CBC WITH DIFFERENTIAL/PLATELET
Basophils Absolute: 0 10*3/uL (ref 0.0–0.1)
Basophils Relative: 0.6 % (ref 0.0–3.0)
Eosinophils Absolute: 0.1 10*3/uL (ref 0.0–0.7)
Eosinophils Relative: 2.2 % (ref 0.0–5.0)
HCT: 40.7 % (ref 36.0–46.0)
Hemoglobin: 13.8 g/dL (ref 12.0–15.0)
Lymphocytes Relative: 25.6 % (ref 12.0–46.0)
Lymphs Abs: 1.6 10*3/uL (ref 0.7–4.0)
MCHC: 33.9 g/dL (ref 30.0–36.0)
MCV: 92.9 fl (ref 78.0–100.0)
Monocytes Absolute: 0.3 10*3/uL (ref 0.1–1.0)
Monocytes Relative: 5.8 % (ref 3.0–12.0)
Neutro Abs: 4 10*3/uL (ref 1.4–7.7)
Neutrophils Relative %: 65.8 % (ref 43.0–77.0)
Platelets: 259 10*3/uL (ref 150.0–400.0)
RBC: 4.38 Mil/uL (ref 3.87–5.11)
RDW: 12.7 % (ref 11.5–15.5)
WBC: 6.1 10*3/uL (ref 4.0–10.5)

## 2021-02-10 NOTE — Addendum Note (Signed)
Addended by: Miguel Aschoff on: 02/10/2021 09:30 AM   Modules accepted: Orders

## 2021-02-10 NOTE — Patient Instructions (Signed)
Your provider has requested that you go to the basement level for lab work before leaving today. Press "B" on the elevator. The lab is located at the first door on the left as you exit the elevator.  You have been scheduled for an endoscopy and colonoscopy. Please follow the written instructions given to you at your visit today. Please pick up your prep supplies at the pharmacy within the next 1-3 days. If you use inhalers (even only as needed), please bring them with you on the day of your procedure.  If you are age 1 or older, your body mass index should be between 23-30. Your Body mass index is 33.98 kg/m. If this is out of the aforementioned range listed, please consider follow up with your Primary Care Provider.  If you are age 63 or younger, your body mass index should be between 19-25. Your Body mass index is 33.98 kg/m. If this is out of the aformentioned range listed, please consider follow up with your Primary Care Provider.   __________________________________________________________  The [facility] providers would like to encourage you to use [facility] to communicate with providers for non-urgent requests or questions.  Due to long hold times on the telephone, sending your provider a message by [facility] may be a faster and more efficient way to get a response.  Please allow 48 business hours for a response.  Please remember that this is for non-urgent requests.

## 2021-02-10 NOTE — Progress Notes (Signed)
Chief Complaint: Abdominal pain and diarrhea  HPI:    Tammie Wright is a 50 year old female with a past medical history as listed below including reflux and IBS, known to Dr. Leone Payor, who was referred to me by Karie Schwalbe, MD for a complaint of abdominal pain and diarrhea.    08/10/2014 patient seen by Dr. Shelle Iron with [facility].  At that time described that she had an EGD and colonoscopy 04/2013.  EGD showed mild erythema at the GE junction, pathology showed reflux changes.  Colonoscopy had 1 small polyp in the rectum which was hyperplastic.  Apparently had an ultrasound liver as well which showed hepatic steatosis.  At that time been doing well reflux controlled on pant taupe resolved 40 mg daily.  She had prior left lower quadrant pain should resolved after endometrial surgery.    12/31/2020 CT of the abdomen pelvis with contrast showed no acute findings.    Today, patient tells me that over the past 2 to 3 months she has had a change in bowel habits noting that they are more frequent than normal and oftentimes looser than normal.  She also has days of straining and constipation.  She has been having to use her Bentyl more often, sometimes she will not have to use it for a couple of weeks but then she may have to use it 3 to 4 days out of the week for generalized cramping/pain, sometimes worse in her left lower quadrant.  Associated symptoms include nausea which seems to be worse when her "stomach symptoms are flared".    Also describes chronic reflux for which she is on Pantoprazole 40 mg once daily.  She tells me that if she misses a dose then she feels it.    Also tells me that she is going through menopause.  Describes that her neighbor told her she might have celiac disease/gluten sensitivity.    Denies fever, chills, weight loss or symptoms that awaken her from sleep.  Past Medical History:  Diagnosis Date   Allergic rhinitis due to pollen    Allergic rhinitis, cause unspecified    Endometriosis     GERD (gastroesophageal reflux disease)    IBS (irritable bowel syndrome)     Past Surgical History:  Procedure Laterality Date   COLONOSCOPY  9/14   and EGD--Dr [facility]   NASAL STENOSIS REPAIR  2007   Dr Willeen Cass   OOPHORECTOMY Left 01/2014   and both tubes---for endometriosis   TONSILLECTOMY  1999   had remnant removed 2010 by Dr Willeen Cass   Uterine ablation  2013    Current Outpatient Medications  Medication Sig Dispense Refill   B Complex-C-Folic Acid (STRESS B COMPLEX PO) Take by mouth.     Cholecalciferol (VITAMIN D3) 50 MCG (2000 UT) capsule Take 2,000 Units by mouth daily.     dicyclomine (BENTYL) 10 MG capsule Take 1 capsule (10 mg total) by mouth 3 (three) times daily as needed for spasms. 60 capsule 0   fexofenadine (ALLEGRA) 180 MG tablet Take 180 mg by mouth daily.     fluticasone (FLONASE) 50 MCG/ACT nasal spray Place 1 spray into the nose daily.     levocetirizine (XYZAL) 5 MG tablet Take 5 mg by mouth every evening.     montelukast (SINGULAIR) 10 MG tablet TAKE 1 TABLET(10 MG) BY MOUTH DAILY 90 tablet 3   pantoprazole (PROTONIX) 40 MG tablet TAKE 1 TABLET(40 MG) BY MOUTH DAILY 90 tablet 3   No current facility-administered medications for  this visit.    Allergies as of 02/10/2021 - Review Complete 02/10/2021  Allergen Reaction Noted   Levaquin [levofloxacin]  02/10/2021    Family History  Problem Relation Age of Onset   Cancer Father        bladder cancer   Hypertension Maternal Grandmother    Hypothyroidism Maternal Grandmother    Heart disease Maternal Grandfather    Cancer Paternal Aunt        breast cancer   Breast cancer Paternal Aunt    Diabetes Neg Hx     Social History   Socioeconomic History   Marital status: Married    Spouse name: Not on file   Number of children: 0   Years of education: Not on file   Highest education level: Not on file  Occupational History   Occupation: Patient accounts    Comment: [employer]  Tobacco Use   Smoking  status: Never   Smokeless tobacco: Never  Substance and Sexual Activity   Alcohol use: No   Drug use: No   Sexual activity: Not on file  Other Topics Concern   Not on file  Social History Narrative   Not on file   Social Determinants of Health   Financial Resource Strain: Not on file  Food Insecurity: Not on file  Transportation Needs: Not on file  Physical Activity: Not on file  Stress: Not on file  Social Connections: Not on file  Intimate Partner Violence: Not on file    Review of Systems:    Constitutional: No weight loss, fever or chills Skin: No rash  Cardiovascular: No chest pain Respiratory: No SOB  Gastrointestinal: See HPI and otherwise negative Genitourinary: No dysuria Neurological: No headache, dizziness or syncope Musculoskeletal: No new muscle or joint pain Hematologic: No bleeding Psychiatric: No history of depression or anxiety   Physical Exam:  Vital signs: Ht 4' 11.5" (1.511 m) Comment: height measured without shoes  Wt 171 lb 2 oz (77.6 kg)   BMI 33.98 kg/m   Constitutional:   Pleasant overweight Caucasian female appears to be in NAD, Well developed, Well nourished, alert and cooperative Head:  Normocephalic and atraumatic. Eyes:   PEERL, EOMI. No icterus. Conjunctiva pink. Ears:  Normal auditory acuity. Neck:  Supple Throat: Oral cavity and pharynx without inflammation, swelling or lesion.  Respiratory: Respirations even and unlabored. Lungs clear to auscultation bilaterally.   No wheezes, crackles, or rhonchi.  Cardiovascular: Normal S1, S2. No MRG. Regular rate and rhythm. No peripheral edema, cyanosis or pallor.  Gastrointestinal:  Soft, nondistended, mild left lower quadrant TTP no rebound or guarding. Normal bowel sounds. No appreciable masses or hepatomegaly. Rectal:  Not performed.  Msk:  Symmetrical without gross deformities. Without edema, no deformity or joint abnormality.  Neurologic:  Alert and  oriented x4;  grossly normal  neurologically.  Skin:   Dry and intact without significant lesions or rashes. Psychiatric:  Demonstrates good judgement and reason without abnormal affect or behaviors.  No recent labs.  (See HPI for recent CT)  Assessment: 1.  Abdominal pain: Generalized abdominal cramping, some better after Bentyl, previous diagnosis of IBS; likely continued IBS 2.  Change in bowel habits: Over the past 2 to 3 months, last colonoscopy in 2014 with a hyperplastic polyp and otherwise normal, previous diagnosis of IBS; consider with menopause versus IBS versus other 3.  Nausea: With above 4.  GERD: Chronic for the patient, typically maintained on Pantoprazole 40 mg once daily  Plan: 1.  Patient was  previously established with Dr. Leone Payor.  She wishes to continue following with him and due to this her EGD/colonoscopy were scheduled in October when he is available.  Did provide the patient a detailed list of risks for the procedures and she agrees to proceed.  Patient is appropriate for endoscopic procedures in ambulatory ([facility]) setting 2.  Discussed that celiac disease is less likely but ordered labs including TTG and IgA today per her request.  Also ordered CBC and CMP to update. 3.  Patient can continue to use Bentyl as needed for now 4.  Continue Pantoprazole 40 mg once daily 5.  Patient to follow in clinic per recommendations from Dr. Leone Payor after time of procedures.  Hyacinth Meeker, PA-C [facility] 02/10/2021, 8:32 AM  Cc: Karie Schwalbe, MD.,

## 2021-02-13 LAB — IGA: Immunoglobulin A: 176 mg/dL (ref 47–310)

## 2021-02-13 LAB — TISSUE TRANSGLUTAMINASE ABS,IGG,IGA
(tTG) Ab, IgA: <1 U/mL
(tTG) Ab, IgG: <1 U/mL

## 2021-02-17 ENCOUNTER — Encounter: Payer: Self-pay | Admitting: Internal Medicine

## 2021-02-17 ENCOUNTER — Other Ambulatory Visit: Payer: Self-pay

## 2021-02-17 ENCOUNTER — Ambulatory Visit (INDEPENDENT_AMBULATORY_CARE_PROVIDER_SITE_OTHER): Payer: BC Managed Care – PPO | Admitting: Internal Medicine

## 2021-02-17 VITALS — BP 146/82 | HR 79 | Temp 97.7°F | Ht 59.25 in | Wt 171.0 lb

## 2021-02-17 DIAGNOSIS — K582 Mixed irritable bowel syndrome: Secondary | ICD-10-CM | POA: Diagnosis not present

## 2021-02-17 DIAGNOSIS — R030 Elevated blood-pressure reading, without diagnosis of hypertension: Secondary | ICD-10-CM | POA: Diagnosis not present

## 2021-02-17 DIAGNOSIS — J301 Allergic rhinitis due to pollen: Secondary | ICD-10-CM | POA: Diagnosis not present

## 2021-02-17 DIAGNOSIS — Z23 Encounter for immunization: Secondary | ICD-10-CM

## 2021-02-17 DIAGNOSIS — Z0000 Encounter for general adult medical examination without abnormal findings: Secondary | ICD-10-CM

## 2021-02-17 NOTE — Assessment & Plan Note (Signed)
Healthy Discussed fitness efforts Colonoscopy soon Yearly mammogram in November Recommended bivalent COVID vaccine--she is not excited about this Flu vaccine today

## 2021-02-17 NOTE — Progress Notes (Signed)
Subjective:    Patient ID: Tammie Wright, female    DOB: January 28, 1971, 50 y.o.   MRN: [id number]  HPI Here for physical This visit occurred during the SARS-CoV-2 public health emergency.  Safety protocols were in place, including screening questions prior to the visit, additional usage of staff PPE, and extensive cleaning of exam room while observing appropriate contact time as indicated for disinfecting solutions.   Abdominal pain is "leveling out" Hopefully it is all just IBS Protonix and diclyclomine prn  Back in the office full time  Allergy meds seem to work ---still gets itchy eyes  Current Outpatient Medications on File Prior to Visit  Medication Sig Dispense Refill   B Complex-C-Folic Acid (STRESS B COMPLEX PO) Take by mouth.     dicyclomine (BENTYL) 10 MG capsule Take 1 capsule (10 mg total) by mouth 3 (three) times daily as needed for spasms. 60 capsule 0   fexofenadine (ALLEGRA) 180 MG tablet Take 180 mg by mouth daily.     fluticasone (FLONASE) 50 MCG/ACT nasal spray Place 1 spray into the nose daily.     levocetirizine (XYZAL) 5 MG tablet Take 5 mg by mouth every evening.     montelukast (SINGULAIR) 10 MG tablet TAKE 1 TABLET(10 MG) BY MOUTH DAILY 90 tablet 3   pantoprazole (PROTONIX) 40 MG tablet TAKE 1 TABLET(40 MG) BY MOUTH DAILY 90 tablet 3   No current facility-administered medications on file prior to visit.    Allergies  Allergen Reactions   Levaquin [Levofloxacin]     Achilles rupture    Past Medical History:  Diagnosis Date   Allergic rhinitis due to pollen    Allergic rhinitis, cause unspecified    Endometriosis    GERD (gastroesophageal reflux disease)    IBS (irritable bowel syndrome)    Osteoarthritis     Past Surgical History:  Procedure Laterality Date   COLONOSCOPY  9/14   and EGD--Dr Rein   NASAL STENOSIS REPAIR  2007   Dr Willeen Cass   OOPHORECTOMY Left 01/2014   and both tubes---for endometriosis   TONSILLECTOMY  1999   had remnant  removed 2010 by Dr Willeen Cass   Uterine ablation  2013    Family History  Problem Relation Age of Onset   Alcoholism Mother    Bladder Cancer Father    Hypertension Maternal Grandmother    Thyroid disease Maternal Grandmother    Heart disease Maternal Grandfather    Breast cancer Paternal Aunt    Diabetes Neg Hx     Social History   Socioeconomic History   Marital status: Married    Spouse name: Not on file   Number of children: 0   Years of education: Not on file   Highest education level: Not on file  Occupational History   Occupation: Patient accounts    Comment: [employer]  Tobacco Use   Smoking status: Never   Smokeless tobacco: Never  Vaping Use   Vaping Use: Never used  Substance and Sexual Activity   Alcohol use: No   Drug use: No   Sexual activity: Not on file  Other Topics Concern   Not on file  Social History Narrative   Not on file   Social Determinants of Health   Financial Resource Strain: Not on file  Food Insecurity: Not on file  Transportation Needs: Not on file  Physical Activity: Not on file  Stress: Not on file  Social Connections: Not on file  Intimate Partner Violence: Not on  file   Review of Systems  Constitutional:  Negative for fatigue and unexpected weight change.       No real exercise---discussed Wears seat belt   HENT:  Negative for hearing loss and tinnitus.        Recent dental visit--needed filling  Eyes:  Negative for visual disturbance.       No diplopia or unilateral vision loss  Respiratory:  Negative for cough, chest tightness and shortness of breath.   Cardiovascular:  Negative for chest pain, palpitations and leg swelling.  Gastrointestinal:  Positive for abdominal pain, constipation and diarrhea.  Genitourinary:  Negative for dyspareunia, dysuria and hematuria.       LMP 09/16/20---some hot flashes   Musculoskeletal:  Negative for back pain and joint swelling.       Occ left knee or ankle pain  Skin:  Negative for rash.        No suspicious skin lesions  Allergic/Immunologic: Positive for environmental allergies. Negative for immunocompromised state.  Neurological:  Negative for dizziness, syncope and light-headedness.       Rare sinus headaches  Hematological:  Negative for adenopathy. Does not bruise/bleed easily.  Psychiatric/Behavioral:  Negative for dysphoric mood and sleep disturbance. The patient is not nervous/anxious.       Objective:   Physical Exam Constitutional:      Appearance: Normal appearance.  HENT:     Right Ear: Tympanic membrane and ear canal normal.     Left Ear: Tympanic membrane and ear canal normal.     Mouth/Throat:     Pharynx: No oropharyngeal exudate or posterior oropharyngeal erythema.  Eyes:     Conjunctiva/sclera: Conjunctivae normal.     Pupils: Pupils are equal, round, and reactive to light.  Cardiovascular:     Rate and Rhythm: Normal rate and regular rhythm.     Pulses: Normal pulses.     Heart sounds: No murmur heard.   No gallop.  Pulmonary:     Effort: Pulmonary effort is normal.     Breath sounds: Normal breath sounds. No wheezing or rales.  Abdominal:     Palpations: Abdomen is soft.     Tenderness: There is no abdominal tenderness.  Musculoskeletal:     Cervical back: Neck supple.     Right lower leg: No edema.     Left lower leg: No edema.  Lymphadenopathy:     Cervical: No cervical adenopathy.  Skin:    General: Skin is warm.     Findings: No rash.  Neurological:     General: No focal deficit present.     Mental Status: She is alert and oriented to person, place, and time.  Psychiatric:        Mood and Affect: Mood normal.        Behavior: Behavior normal.           Assessment & Plan:

## 2021-02-17 NOTE — Assessment & Plan Note (Signed)
BP Readings from Last 3 Encounters:  02/17/21 (!) 146/82  02/10/21 (!) 150/94  12/21/20 122/78   Repeat 122/100 on right She will buy cuff and monitor at home and let me know if over 140/90

## 2021-02-17 NOTE — Patient Instructions (Signed)

## 2021-02-17 NOTE — Addendum Note (Signed)
Addended by: Bridgette Habermann R on: 02/17/2021 12:43 PM   Modules accepted: Orders

## 2021-02-17 NOTE — Assessment & Plan Note (Signed)
Likely the cause of her abdominal pain GI work up planned though

## 2021-02-17 NOTE — Assessment & Plan Note (Signed)
Satisfied with regimen 

## 2021-03-20 ENCOUNTER — Other Ambulatory Visit: Payer: Self-pay | Admitting: Family Medicine

## 2021-03-20 ENCOUNTER — Telehealth: Payer: Self-pay | Admitting: Internal Medicine

## 2021-03-20 ENCOUNTER — Other Ambulatory Visit: Payer: BC Managed Care – PPO

## 2021-03-20 ENCOUNTER — Other Ambulatory Visit: Payer: Self-pay

## 2021-03-20 ENCOUNTER — Telehealth: Payer: BC Managed Care – PPO | Admitting: Family Medicine

## 2021-03-20 ENCOUNTER — Encounter: Payer: Self-pay | Admitting: Family Medicine

## 2021-03-20 DIAGNOSIS — Z20822 Contact with and (suspected) exposure to covid-19: Secondary | ICD-10-CM

## 2021-03-20 NOTE — Telephone Encounter (Signed)
Pt called stating she needs instruction on when to be at the [facility]

## 2021-03-20 NOTE — Telephone Encounter (Signed)
error 

## 2021-03-20 NOTE — Patient Instructions (Signed)
Based on your symptoms, it looks like you have a virus.   Antibiotics are not need for a viral infection but the following will help:   Drink plenty of fluids Get lots of rest  Sinus Congestion 1) Neti Pot (Saline rinse) -- 2 times day -- if tolerated 2) Flonase (Store Brand ok) - once daily 3) Over the counter congestion medications  Cough 1) Cough drops can be helpful 2) Nyquil (or nighttime cough medication) 3) Honey is proven to be one of the best cough medications  4) Cough medicine with Dextromethorphan can also be helpful  Sore Throat 1) Honey as above, cough drops 2) Ibuprofen or Aleve can be helpful 3) Salt water Gargles  If you develop fevers (Temperature >100.4), chills, worsening symptoms or symptoms lasting longer than 10 days return to clinic.    Isolate until results of covid test are back

## 2021-03-20 NOTE — Progress Notes (Signed)
I connected with Tammie Wright on 03/20/21 at 12:20 PM EDT by video and verified that I am speaking with the correct person using two identifiers.   I discussed the limitations, risks, security and privacy concerns of performing an evaluation and management service by video and the availability of in person appointments. I also discussed with the patient that there may be a patient responsible charge related to this service. The patient expressed understanding and agreed to proceed.  Patient location: Home Provider Location: [facility] Participants: Tammie Wright and Tammie Wright   Subjective:     Tammie Wright is a 50 y.o. female presenting for Facial Pain (Between eyes ), Cough (All sx's x 2 days ), Nasal Congestion, and Sore Throat     Cough Episode onset: 03/18/2021. Associated symptoms include headaches, postnasal drip and a sore throat. Pertinent negatives include no chills, ear pain, fever, myalgias or shortness of breath.  Sore Throat  This is a new problem. Associated symptoms include congestion, coughing and headaches. Pertinent negatives include no diarrhea, ear pain, shortness of breath or vomiting. She has had no exposure to strep or mono. She has tried NSAIDs for the symptoms. The treatment provided moderate relief.   Flu in May Scheduled for colonoscopy 04/04/2021  No loss of taste or smell No known exposure to covid, flu, strep throat  Has not tried saline rinse or flonase  Deals with chronic allergies - singulair + allegra in the AM and xyzal at night  Has not tested for covid  Review of Systems  Constitutional:  Negative for chills and fever.  HENT:  Positive for congestion, postnasal drip, sinus pressure, sinus pain (face pain) and sore throat. Negative for ear pain.   Respiratory:  Positive for cough. Negative for shortness of breath.   Gastrointestinal:  Negative for diarrhea, nausea and vomiting.  Musculoskeletal:  Negative for  arthralgias and myalgias.  Neurological:  Positive for headaches.    Social History   Tobacco Use  Smoking Status Never  Smokeless Tobacco Never        Objective:   BP Readings from Last 3 Encounters:  02/17/21 (!) 146/82  02/10/21 (!) 150/94  12/21/20 122/78   Wt Readings from Last 3 Encounters:  02/17/21 171 lb (77.6 kg)  02/10/21 171 lb 2 oz (77.6 kg)  12/21/20 168 lb (76.2 kg)    There were no vitals taken for this visit.  Physical Exam Constitutional:      Appearance: Normal appearance. She is not ill-appearing.  HENT:     Head: Normocephalic and atraumatic.     Right Ear: External ear normal.     Left Ear: External ear normal.  Eyes:     Conjunctiva/sclera: Conjunctivae normal.  Pulmonary:     Effort: Pulmonary effort is normal. No respiratory distress.  Neurological:     Mental Status: She is alert. Mental status is at baseline.  Psychiatric:        Mood and Affect: Mood normal.        Behavior: Behavior normal.        Thought Content: Thought content normal.        Judgment: Judgment normal.           Assessment & Plan:   Problem List Items Addressed This Visit   None Visit Diagnoses     Suspected COVID-19 virus infection    -  Primary      Discussed OTC treatment for viral illness  Patient will come today for drive-up testing Instructed to isolate until the results come back  If negative and symptoms persisting will call back for consideration for course of antibiotics   Return if symptoms worsen or fail to improve.  Tammie Child, MD

## 2021-03-20 NOTE — Telephone Encounter (Signed)
Pt informed

## 2021-03-21 ENCOUNTER — Encounter: Payer: Self-pay | Admitting: Family Medicine

## 2021-03-21 ENCOUNTER — Other Ambulatory Visit: Payer: Self-pay | Admitting: Family Medicine

## 2021-03-21 DIAGNOSIS — J0140 Acute pansinusitis, unspecified: Secondary | ICD-10-CM

## 2021-03-21 LAB — SARS-COV-2, NAA 2 DAY TAT

## 2021-03-21 LAB — SPECIMEN STATUS REPORT

## 2021-03-21 LAB — NOVEL CORONAVIRUS, NAA: SARS-CoV-2, NAA: NOT DETECTED

## 2021-03-21 MED ORDER — AMOXICILLIN-POT CLAVULANATE 875-125 MG PO TABS: 1.0000 | ORAL_TABLET | Freq: Two times a day (BID) | ORAL | 0 refills | Status: AC

## 2021-03-23 ENCOUNTER — Encounter: Payer: Self-pay | Admitting: Family Medicine

## 2021-03-23 DIAGNOSIS — J0140 Acute pansinusitis, unspecified: Secondary | ICD-10-CM

## 2021-03-23 MED ORDER — PROMETHAZINE-DM 6.25-15 MG/5ML PO SYRP: 5.0000 mL | ORAL_SOLUTION | Freq: Four times a day (QID) | ORAL | 0 refills | Status: AC | PRN

## 2021-03-24 ENCOUNTER — Other Ambulatory Visit: Payer: Self-pay | Admitting: Obstetrics and Gynecology

## 2021-03-24 DIAGNOSIS — Z1231 Encounter for screening mammogram for malignant neoplasm of breast: Secondary | ICD-10-CM

## 2021-03-27 ENCOUNTER — Encounter: Payer: BC Managed Care – PPO | Admitting: Internal Medicine

## 2021-03-28 ENCOUNTER — Encounter: Payer: Self-pay | Admitting: Internal Medicine

## 2021-04-04 ENCOUNTER — Encounter: Payer: Self-pay | Admitting: Internal Medicine

## 2021-04-04 ENCOUNTER — Ambulatory Visit (AMBULATORY_SURGERY_CENTER): Payer: BC Managed Care – PPO | Admitting: Internal Medicine

## 2021-04-04 ENCOUNTER — Other Ambulatory Visit: Payer: Self-pay

## 2021-04-04 VITALS — BP 111/80 | HR 75 | Temp 97.3°F | Resp 12 | Ht 59.5 in | Wt 171.0 lb

## 2021-04-04 DIAGNOSIS — K317 Polyp of stomach and duodenum: Secondary | ICD-10-CM | POA: Diagnosis not present

## 2021-04-04 DIAGNOSIS — R1084 Generalized abdominal pain: Secondary | ICD-10-CM | POA: Diagnosis not present

## 2021-04-04 DIAGNOSIS — K219 Gastro-esophageal reflux disease without esophagitis: Secondary | ICD-10-CM

## 2021-04-04 DIAGNOSIS — R194 Change in bowel habit: Secondary | ICD-10-CM | POA: Diagnosis not present

## 2021-04-04 MED ORDER — SODIUM CHLORIDE 0.9 % IV SOLN: 500.0000 mL | Freq: Once | INTRAVENOUS | Status: DC

## 2021-04-04 NOTE — Patient Instructions (Addendum)
There were some stomach polyps seen - I took biopsies but do not suspect that they are a problem.  The remainder of the exams were normal. I also took colon biopsies to check for microscopic inflammation.  I will let you know results and recommendations.  I hope the job situation improves.  I appreciate the opportunity to care for you. Iva Boop, MD, [facility] Resume previous diet and present medications. Repeat colonoscopy date to be determined based on pathology results.   YOU HAD AN ENDOSCOPIC PROCEDURE TODAY AT THE [facility]:   Refer to the procedure report that was given to you for any specific questions about what was found during the examination.  If the procedure report does not answer your questions, please call your gastroenterologist to clarify.  If you requested that your care partner not be given the details of your procedure findings, then the procedure report has been included in a sealed envelope for you to review at your convenience later.  YOU SHOULD EXPECT: Some feelings of bloating in the abdomen. Passage of more gas than usual.  Walking can help get rid of the air that was put into your GI tract during the procedure and reduce the bloating. If you had a lower endoscopy (such as a colonoscopy or flexible sigmoidoscopy) you may notice spotting of blood in your stool or on the toilet paper. If you underwent a bowel prep for your procedure, you may not have a normal bowel movement for a few days.  Please Note:  You might notice some irritation and congestion in your nose or some drainage.  This is from the oxygen used during your procedure.  There is no need for concern and it should clear up in a day or so.  SYMPTOMS TO REPORT IMMEDIATELY:  Following lower endoscopy (colonoscopy or flexible sigmoidoscopy):  Excessive amounts of blood in the stool  Significant tenderness or worsening of abdominal pains  Swelling of the abdomen that is new, acute  Fever of  100F or higher  Following upper endoscopy (EGD)  Vomiting of blood or coffee ground material  New chest pain or pain under the shoulder blades  Painful or persistently difficult swallowing  New shortness of breath  Fever of 100F or higher  Black, tarry-looking stools  For urgent or emergent issues, a gastroenterologist can be reached at any hour by calling [phone]. Do not use MyChart messaging for urgent concerns.    DIET:  We do recommend a small meal at first, but then you may proceed to your regular diet.  Drink plenty of fluids but you should avoid alcoholic beverages for 24 hours.  ACTIVITY:  You should plan to take it easy for the rest of today and you should NOT DRIVE or use heavy machinery until tomorrow (because of the sedation medicines used during the test).    FOLLOW UP: Our staff will call the number listed on your records 48-72 hours following your procedure to check on you and address any questions or concerns that you may have regarding the information given to you following your procedure. If we do not reach you, we will leave a message.  We will attempt to reach you two times.  During this call, we will ask if you have developed any symptoms of COVID 19. If you develop any symptoms (ie: fever, flu-like symptoms, shortness of breath, cough etc.) before then, please call [phone].  If you test positive for Covid 19 in the 2 weeks post  procedure, please call and report this information to [place].    If any biopsies were taken you will be contacted by phone or by letter within the next 1-3 weeks.  Please call us at [phone] if you have not heard about the biopsies in 3 weeks.    SIGNATURES/CONFIDENTIALITY: You and/or your care partner have signed paperwork which will be entered into your electronic medical record.  These signatures attest to the fact that that the information above on your After Visit Summary has been reviewed and is understood.  Full  responsibility of the confidentiality of this discharge information lies with you and/or your care-partner.

## 2021-04-04 NOTE — Op Note (Signed)
[facility] Patient Name: Tammie Wright Procedure Date: 04/04/2021 7:38 AM MRN: [id number] Endoscopist: Iva Boop , MD Age: 50 Referring MD:  Date of Birth: 06-26-1970 Gender: Female Account #: [id number] Procedure:                Colonoscopy Indications:              Change in bowel habits Medicines:                Propofol per Anesthesia, Monitored Anesthesia Care Procedure:                Pre-Anesthesia Assessment:                           - Prior to the procedure, a History and Physical                            was performed, and patient medications and                            allergies were reviewed. The patient's tolerance of                            previous anesthesia was also reviewed. The risks                            and benefits of the procedure and the sedation                            options and risks were discussed with the patient.                            All questions were answered, and informed consent                            was obtained. Prior Anticoagulants: The patient has                            taken no previous anticoagulant or antiplatelet                            agents. ASA Grade Assessment: II - A patient with                            mild systemic disease. After reviewing the risks                            and benefits, the patient was deemed in                            satisfactory condition to undergo the procedure.                           After obtaining informed consent, the colonoscope  was passed under direct vision. Throughout the                            procedure, the patient's blood pressure, pulse, and                            oxygen saturations were monitored continuously. The                            Olympus PCF-[id number] (#[id number]) Colonoscope was                            introduced through the anus and advanced to the the                            terminal  ileum, with identification of the                            appendiceal orifice and IC valve. The colonoscopy                            was performed without difficulty. The patient                            tolerated the procedure well. The quality of the                            bowel preparation was good. The terminal ileum,                            ileocecal valve, appendiceal orifice, and rectum                            were photographed. The bowel preparation used was                            Miralax via split dose instruction. Scope In: 8:20:49 AM Scope Out: 8:31:47 AM Total Procedure Duration: 0 hours 10 minutes 58 seconds  Findings:                 The perianal and digital rectal examinations were                            normal.                           The terminal ileum appeared normal.                           The entire examined colon appeared normal on direct                            and retroflexion views.  Biopsies for histology were taken with a cold                            forceps from the right colon, left colon and rectum                            for evaluation of microscopic colitis. Estimated                            blood loss was minimal. Complications:            No immediate complications. Estimated Blood Loss:     Estimated blood loss was minimal. Impression:               - The examined portion of the ileum was normal.                           - The entire examined colon is normal on direct and                            retroflexion views.                           - Biopsies were taken with a cold forceps from the                            right colon, left colon and rectum for evaluation                            of microscopic colitis. Recommendation:           - Patient has a contact number available for                            emergencies. The signs and symptoms of potential                             delayed complications were discussed with the                            patient. Return to normal activities tomorrow.                            Written discharge instructions were provided to the                            patient.                           - Repeat colonoscopy is recommended. The                            colonoscopy date will be determined after pathology  results from today's exam become available for                            review.                           - Await results - sig work stress could be playing                            a role in pain and diarrhea issues - IBS Iva Boop, MD 04/04/2021 8:42:58 AM This report has been signed electronically.

## 2021-04-04 NOTE — Progress Notes (Signed)
Pt Drowsy. VSS. To PACU, report to RN. No anesthetic complications noted.  

## 2021-04-04 NOTE — Progress Notes (Signed)
[facility] History and Physical   Primary Care Physician:  Karie Schwalbe, MD   Reason for Procedure:   GERD, abdominal pain and diarrhea  Plan:    EGD, colonoscopy     HPI: Tammie Wright is a 50 y.o. female here to evaluate GERD, abdominal cramps and diarrhea.   Past Medical History:  Diagnosis Date   Allergic rhinitis due to pollen    Allergic rhinitis, cause unspecified    Allergy    Endometriosis    GERD (gastroesophageal reflux disease)    IBS (irritable bowel syndrome)    Osteoarthritis     Past Surgical History:  Procedure Laterality Date   COLONOSCOPY  02/2013   and EGD--Dr [facility]   NASAL STENOSIS REPAIR  2007   Dr Willeen Cass   OOPHORECTOMY Left 01/2014   and both tubes---for endometriosis   TONSILLECTOMY  1999   had remnant removed 2010 by Dr Willeen Cass   UPPER GASTROINTESTINAL ENDOSCOPY     Uterine ablation  2013    Prior to Admission medications   Medication Sig Start Date End Date Taking? Authorizing Provider  dicyclomine (BENTYL) 10 MG capsule Take 1 capsule (10 mg total) by mouth 3 (three) times daily as needed for spasms. 09/05/20  Yes Karie Schwalbe, MD  fexofenadine (ALLEGRA) 180 MG tablet Take 180 mg by mouth daily.   Yes [provider]  levocetirizine (XYZAL) 5 MG tablet Take 5 mg by mouth every evening.   Yes [provider]  montelukast (SINGULAIR) 10 MG tablet TAKE 1 TABLET(10 MG) BY MOUTH DAILY 04/28/20  Yes Tillman Abide I, MD  pantoprazole (PROTONIX) 40 MG tablet TAKE 1 TABLET(40 MG) BY MOUTH DAILY 04/28/20  Yes Karie Schwalbe, MD  B Complex-C-Folic Acid (STRESS B COMPLEX PO) Take by mouth.    [provider]  fluticasone (FLONASE) 50 MCG/ACT nasal spray Place 1 spray into the nose daily.    [provider]  promethazine-dextromethorphan (PROMETHAZINE-DM) 6.25-15 MG/5ML syrup Take 5 mLs by mouth 4 (four) times daily as needed for cough. 03/23/21   Lynnda Child, MD    Current  Outpatient Medications  Medication Sig Dispense Refill   dicyclomine (BENTYL) 10 MG capsule Take 1 capsule (10 mg total) by mouth 3 (three) times daily as needed for spasms. 60 capsule 0   fexofenadine (ALLEGRA) 180 MG tablet Take 180 mg by mouth daily.     levocetirizine (XYZAL) 5 MG tablet Take 5 mg by mouth every evening.     montelukast (SINGULAIR) 10 MG tablet TAKE 1 TABLET(10 MG) BY MOUTH DAILY 90 tablet 3   pantoprazole (PROTONIX) 40 MG tablet TAKE 1 TABLET(40 MG) BY MOUTH DAILY 90 tablet 3   B Complex-C-Folic Acid (STRESS B COMPLEX PO) Take by mouth.     fluticasone (FLONASE) 50 MCG/ACT nasal spray Place 1 spray into the nose daily.     promethazine-dextromethorphan (PROMETHAZINE-DM) 6.25-15 MG/5ML syrup Take 5 mLs by mouth 4 (four) times daily as needed for cough. 118 mL 0   Current Facility-Administered Medications  Medication Dose Route Frequency Provider Last Rate Last Admin   0.9 %  sodium chloride infusion  500 mL Intravenous Once Iva Boop, MD        Allergies as of 04/04/2021 - Review Complete 04/04/2021  Allergen Reaction Noted   Levaquin [levofloxacin]  02/10/2021    Family History  Problem Relation Age of Onset   Alcoholism Mother    Bladder Cancer Father    Breast cancer  Paternal Aunt    Hypertension Maternal Grandmother    Thyroid disease Maternal Grandmother    Heart disease Maternal Grandfather    Diabetes Neg Hx    Colon cancer Neg Hx    Colon polyps Neg Hx    Esophageal cancer Neg Hx    Rectal cancer Neg Hx    Stomach cancer Neg Hx     Social History   Socioeconomic History   Marital status: Married    Spouse name: Not on file   Number of children: 0   Years of education: Not on file   Highest education level: Not on file  Occupational History   Occupation: Patient accounts    Comment: [employer]  Tobacco Use   Smoking status: Never   Smokeless tobacco: Never  Vaping Use   Vaping Use: Never used  Substance and Sexual Activity   Alcohol  use: No   Drug use: No   Sexual activity: Not on file  Other Topics Concern   Not on file  Social History Narrative   Not on file   Social Determinants of Health   Financial Resource Strain: Not on file  Food Insecurity: Not on file  Transportation Needs: Not on file  Physical Activity: Not on file  Stress: Not on file  Social Connections: Not on file  Intimate Partner Violence: Not on file    Review of Systems:  All other review of systems negative except as mentioned in the HPI.  Physical Exam: Vital signs BP (!) 127/91   Pulse 92   Temp (!) 97.3 F (36.3 C)   Resp 15   Ht 4' 11.5" (1.511 m)   Wt 171 lb (77.6 kg)   SpO2 100%   BMI 33.96 kg/m   General:   Alert,  Well-developed, well-nourished, pleasant and cooperative in NAD Lungs:  Clear throughout to auscultation.   Heart:  Regular rate and rhythm; no murmurs, clicks, rubs,  or gallops. Abdomen:  Soft, nontender and nondistended. Normal bowel sounds.   Neuro/Psych:  Alert and cooperative. Normal mood and affect. A and O x 3   @Kaydyn Sayas  , MD, [facility] [phone] (pager) 04/04/2021 8:07 AM@

## 2021-04-04 NOTE — Op Note (Signed)
[facility] Patient Name: Tammie Wright Procedure Date: 04/04/2021 7:39 AM MRN: [id number] Endoscopist: Iva Boop , MD Age: 50 Referring MD:  Date of Birth: 24-Dec-1970 Gender: Female Account #: [id number] Procedure:                Upper GI endoscopy Indications:              Generalized abdominal pain, Gastro-esophageal                            reflux disease, Follow-up of gastro-esophageal                            reflux disease Medicines:                Propofol per Anesthesia, Monitored Anesthesia Care Procedure:                Pre-Anesthesia Assessment:                           - Prior to the procedure, a History and Physical                            was performed, and patient medications and                            allergies were reviewed. The patient's tolerance of                            previous anesthesia was also reviewed. The risks                            and benefits of the procedure and the sedation                            options and risks were discussed with the patient.                            All questions were answered, and informed consent                            was obtained. Prior Anticoagulants: The patient has                            taken no previous anticoagulant or antiplatelet                            agents. ASA Grade Assessment: II - A patient with                            mild systemic disease. After reviewing the risks                            and benefits, the patient was deemed in  satisfactory condition to undergo the procedure.                           After obtaining informed consent, the endoscope was                            passed under direct vision. Throughout the                            procedure, the patient's blood pressure, pulse, and                            oxygen saturations were monitored continuously. The                            GIF [id number]  was introduced through the                            mouth, and advanced to the second part of duodenum.                            The upper GI endoscopy was accomplished without                            difficulty. The patient tolerated the procedure                            well. Scope In: 8:11:36 AM Scope Out: 8:17:34 AM Total Procedure Duration: 0 hours 5 minutes 58 seconds  Findings:                 The examined esophagus was normal.                           Multiple diminutive semi-sessile polyps with no                            stigmata of recent bleeding were found in the                            gastric body. Biopsies were taken with a cold                            forceps for histology. Verification of patient                            identification for the specimen was done. Estimated                            blood loss was minimal.                           The examined duodenum was normal.  The cardia and gastric fundus were otherwise normal                            on retroflexion.                           The exam was otherwise without abnormality. Complications:            No immediate complications. Estimated Blood Loss:     Estimated blood loss was minimal. Impression:               - Normal esophagus.                           - Multiple gastric polyps. Biopsied.                           - Normal examined duodenum.                           - The examination was otherwise normal. Recommendation:           - Patient has a contact number available for                            emergencies. The signs and symptoms of potential                            delayed complications were discussed with the                            patient. Return to normal activities tomorrow.                            Written discharge instructions were provided to the                            patient.                           - Resume  previous diet.                           - Continue present medications.                           - Await pathology results.                           - See the other procedure note for documentation of                            additional recommendations. Iva Boop, MD 04/04/2021 8:40:01 AM This report has been signed electronically.

## 2021-04-04 NOTE — Progress Notes (Signed)
JK - VS

## 2021-04-06 ENCOUNTER — Telehealth: Payer: Self-pay

## 2021-04-06 NOTE — Telephone Encounter (Signed)
  Follow up Call-  Call back number 04/04/2021  Post procedure Call Back phone  # #[phone] cell  Permission to leave phone message Yes  Some recent data might be hidden     Patient questions:  Do you have a fever, pain , or abdominal swelling? No. Pain Score  0 *  Have you tolerated food without any problems? Yes.    Have you been able to return to your normal activities? Yes.    Do you have any questions about your discharge instructions: Diet   No. Medications  No. Follow up visit  No.  Do you have questions or concerns about your Care? No.  Actions: * If pain score is 4 or above: No action needed, pain <4.

## 2021-05-01 MED ORDER — PANTOPRAZOLE SODIUM 40 MG PO TBEC: DELAYED_RELEASE_TABLET | ORAL | 3 refills | Status: DC

## 2021-05-01 MED ORDER — MONTELUKAST SODIUM 10 MG PO TABS: ORAL_TABLET | ORAL | 3 refills | Status: AC

## 2021-05-01 MED ORDER — DICYCLOMINE HCL 10 MG PO CAPS: 10.0000 mg | ORAL_CAPSULE | Freq: Three times a day (TID) | ORAL | 0 refills | Status: DC | PRN

## 2021-05-24 ENCOUNTER — Ambulatory Visit
Admission: RE | Admit: 2021-05-24 | Discharge: 2021-05-24 | Disposition: A | Discharge status: Home / Self Care | Payer: BC Managed Care – PPO | Source: Ambulatory Visit | Attending: Obstetrics and Gynecology | Admitting: Obstetrics and Gynecology

## 2021-05-24 ENCOUNTER — Other Ambulatory Visit: Payer: Self-pay

## 2021-05-24 DIAGNOSIS — Z1231 Encounter for screening mammogram for malignant neoplasm of breast: Secondary | ICD-10-CM | POA: Diagnosis present

## 2021-05-30 ENCOUNTER — Other Ambulatory Visit: Payer: Self-pay | Admitting: Obstetrics and Gynecology

## 2021-05-30 DIAGNOSIS — N6320 Unspecified lump in the left breast, unspecified quadrant: Secondary | ICD-10-CM

## 2021-05-30 DIAGNOSIS — R928 Other abnormal and inconclusive findings on diagnostic imaging of breast: Secondary | ICD-10-CM

## 2021-06-07 ENCOUNTER — Ambulatory Visit
Admission: RE | Admit: 2021-06-07 | Discharge: 2021-06-07 | Disposition: A | Discharge status: Home / Self Care | Payer: BC Managed Care – PPO | Source: Ambulatory Visit | Attending: Obstetrics and Gynecology | Admitting: Obstetrics and Gynecology

## 2021-06-07 ENCOUNTER — Other Ambulatory Visit: Payer: Self-pay

## 2021-06-07 DIAGNOSIS — R928 Other abnormal and inconclusive findings on diagnostic imaging of breast: Secondary | ICD-10-CM

## 2021-06-07 DIAGNOSIS — N6320 Unspecified lump in the left breast, unspecified quadrant: Secondary | ICD-10-CM | POA: Diagnosis present

## 2021-07-12 ENCOUNTER — Encounter: Payer: Self-pay | Admitting: Internal Medicine

## 2021-07-16 ENCOUNTER — Encounter: Payer: Self-pay | Admitting: Internal Medicine

## 2021-08-23 ENCOUNTER — Other Ambulatory Visit: Payer: Self-pay

## 2021-08-23 ENCOUNTER — Ambulatory Visit (INDEPENDENT_AMBULATORY_CARE_PROVIDER_SITE_OTHER): Payer: Self-pay

## 2021-08-23 ENCOUNTER — Telehealth: Payer: Self-pay

## 2021-08-23 DIAGNOSIS — Z111 Encounter for screening for respiratory tuberculosis: Secondary | ICD-10-CM

## 2021-08-23 NOTE — Telephone Encounter (Signed)
Pt came in for TB Skin test placement. She needs a form filled out and signed by Dr Alphonsus Sias. She is aware it will be next week before it is signed. ?

## 2021-08-25 LAB — TB SKIN TEST
Induration: 0 mm
TB Skin Test: NEGATIVE

## 2021-08-29 NOTE — Telephone Encounter (Signed)
Pt called checking on the status of her paper work ?

## 2021-08-29 NOTE — Telephone Encounter (Signed)
Spoke to pt. Form up front in the brown accordion folder. No charge. ?

## 2021-10-24 ENCOUNTER — Encounter: Payer: Self-pay | Admitting: Internal Medicine

## 2021-10-24 MED ORDER — OMEPRAZOLE 40 MG PO CPDR: 40.0000 mg | DELAYED_RELEASE_CAPSULE | Freq: Every day | ORAL | 3 refills | Status: DC

## 2022-01-30 ENCOUNTER — Encounter: Payer: Self-pay | Admitting: Internal Medicine

## 2022-01-31 MED ORDER — PANTOPRAZOLE SODIUM 40 MG PO TBEC: 40.0000 mg | DELAYED_RELEASE_TABLET | Freq: Every day | ORAL | 1 refills | Status: DC

## 2022-01-31 MED ORDER — DICYCLOMINE HCL 10 MG PO CAPS: 10.0000 mg | ORAL_CAPSULE | Freq: Three times a day (TID) | ORAL | 0 refills | Status: AC | PRN

## 2022-01-31 NOTE — Addendum Note (Signed)
Addended by: Tillman Abide I on: 01/31/2022 01:29 PM   Modules accepted: Orders

## 2022-01-31 NOTE — Telephone Encounter (Signed)
I have removed Dr Alphonsus Sias as PCP

## 2022-03-02 ENCOUNTER — Encounter: Payer: BC Managed Care – PPO | Admitting: Internal Medicine

## 2022-03-12 ENCOUNTER — Encounter: Payer: Self-pay | Admitting: Internal Medicine

## 2022-04-11 ENCOUNTER — Other Ambulatory Visit: Payer: Self-pay | Admitting: Obstetrics and Gynecology

## 2022-04-11 DIAGNOSIS — Z1231 Encounter for screening mammogram for malignant neoplasm of breast: Secondary | ICD-10-CM

## 2022-05-01 ENCOUNTER — Other Ambulatory Visit: Payer: Self-pay | Admitting: Internal Medicine

## 2022-05-25 ENCOUNTER — Ambulatory Visit
Admission: RE | Admit: 2022-05-25 | Discharge: 2022-05-25 | Disposition: A | Discharge status: Home / Self Care | Payer: Managed Care, Other (non HMO) | Source: Ambulatory Visit | Attending: Obstetrics and Gynecology | Admitting: Obstetrics and Gynecology

## 2022-05-25 DIAGNOSIS — Z1231 Encounter for screening mammogram for malignant neoplasm of breast: Secondary | ICD-10-CM

## 2023-02-21 ENCOUNTER — Other Ambulatory Visit: Payer: Self-pay | Admitting: Obstetrics and Gynecology

## 2023-02-21 DIAGNOSIS — Z1231 Encounter for screening mammogram for malignant neoplasm of breast: Secondary | ICD-10-CM

## 2023-05-14 IMAGING — MG MM DIGITAL SCREENING BILAT W/ TOMO AND CAD
8 series · 8 of 24 positions shown · non-contrast
Comparison: Previous exam(s).

ACR Breast Density Category a: The breast tissue is almost entirely
fatty.

CLINICAL DATA: Screening.

EXAM:
DIGITAL SCREENING BILATERAL MAMMOGRAM WITH TOMOSYNTHESIS AND CAD
TECHNIQUE: Bilateral screening digital craniocaudal and mediolateral oblique
mammograms were obtained. Bilateral screening digital breast
tomosynthesis was performed. The images were evaluated with
computer-aided detection.

[R CC synth-2D]
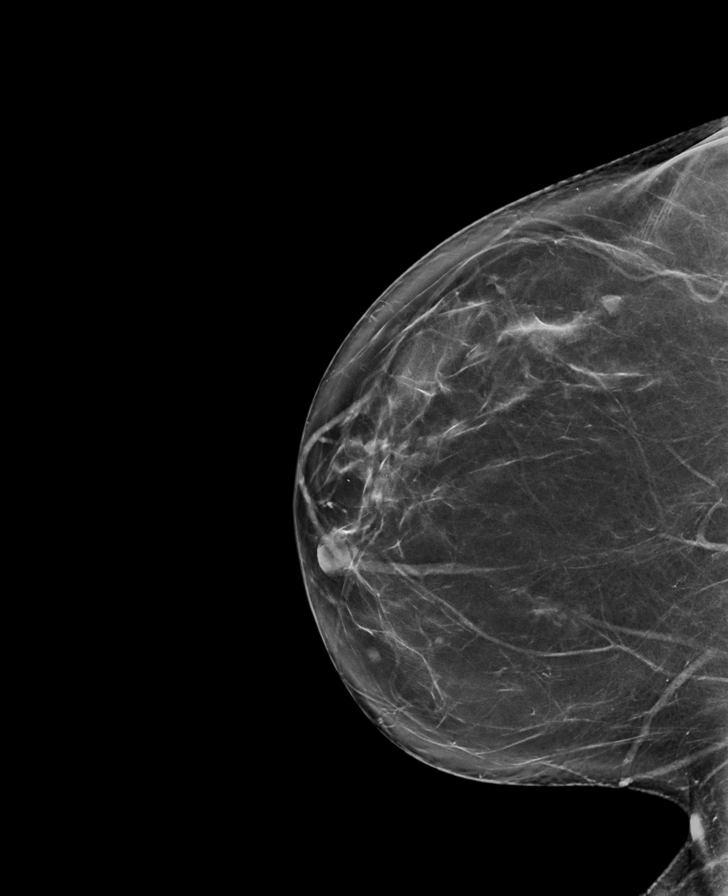

[R MLO synth-2D]
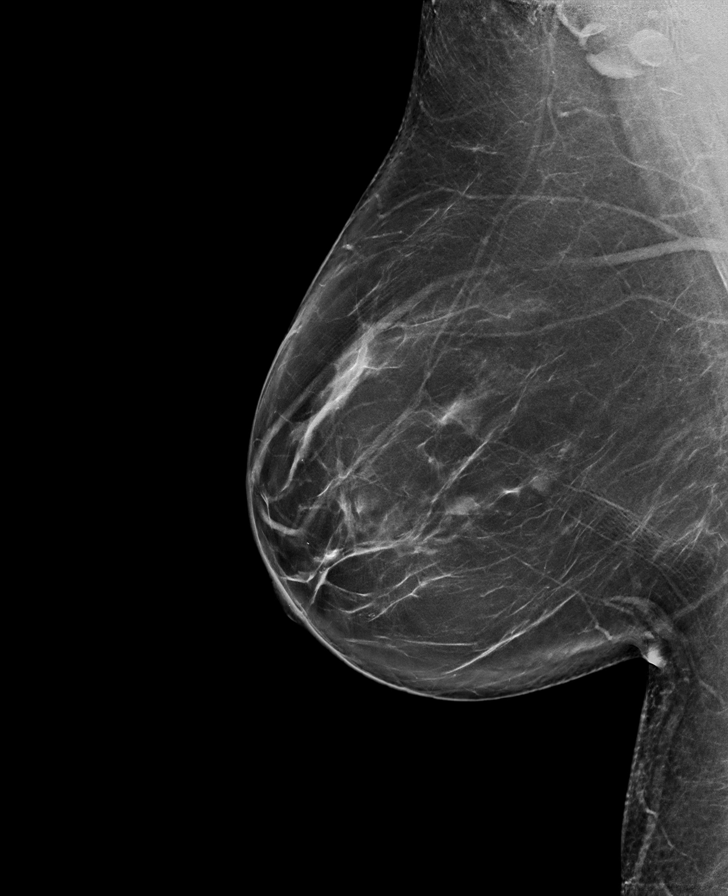

[L MLO synth-2D]
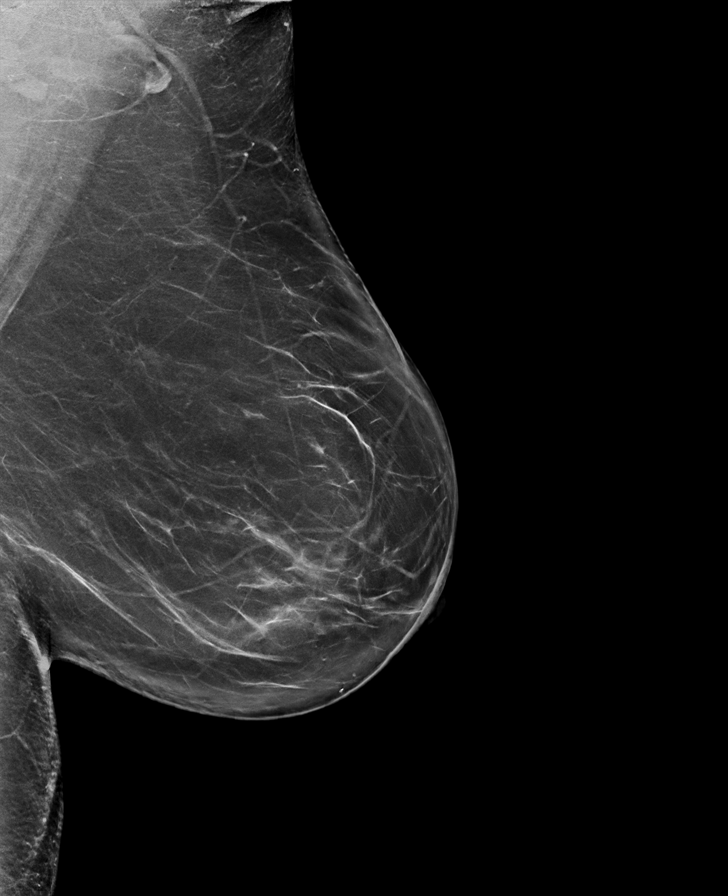

[L CC synth-2D]
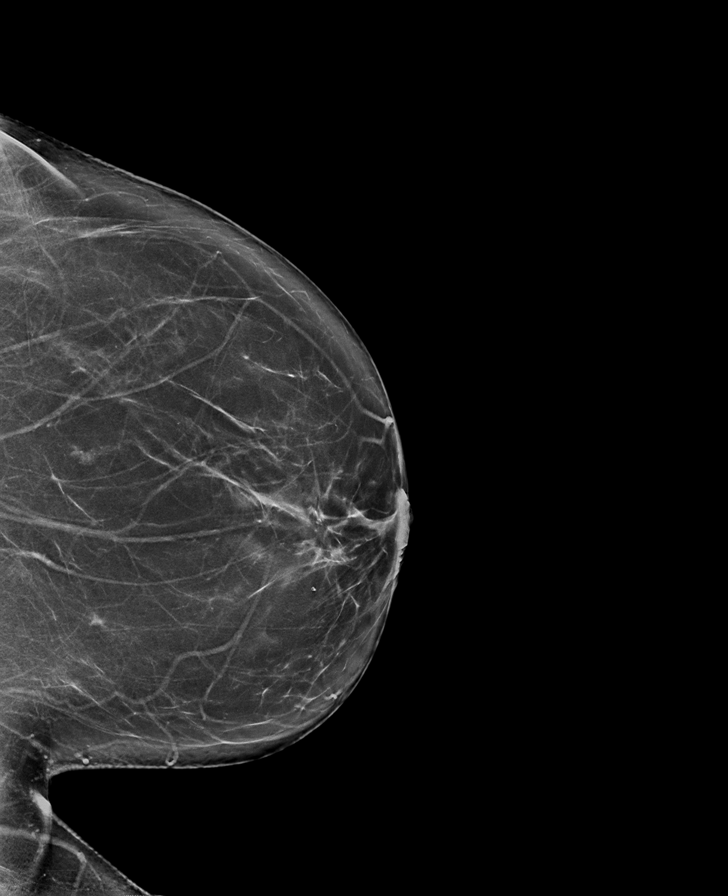

[L MLO tomo · tomo slice 46/91.0]
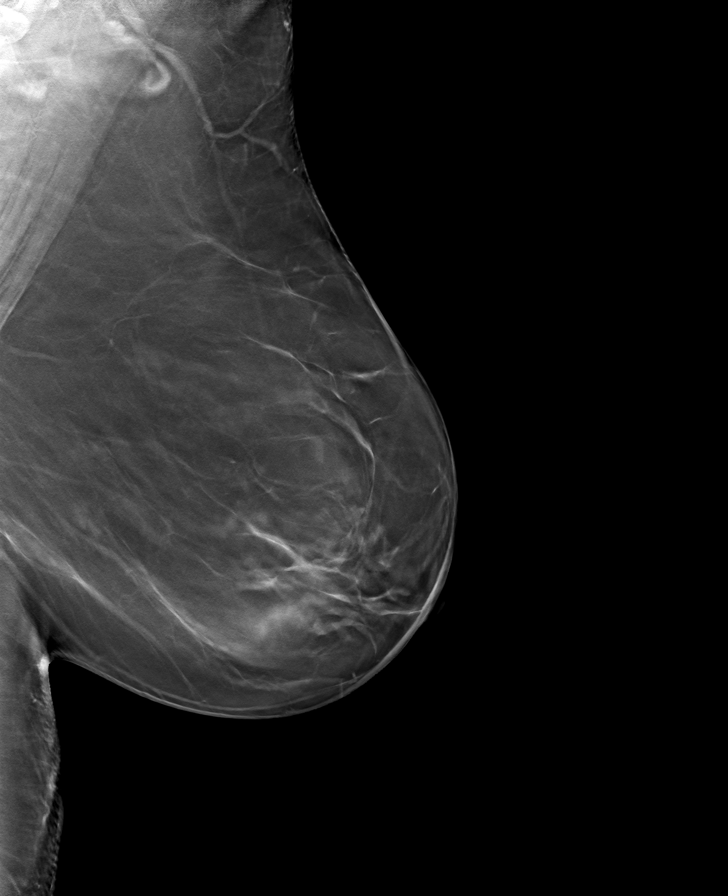

[R CC tomo · tomo slice 39/77.0]
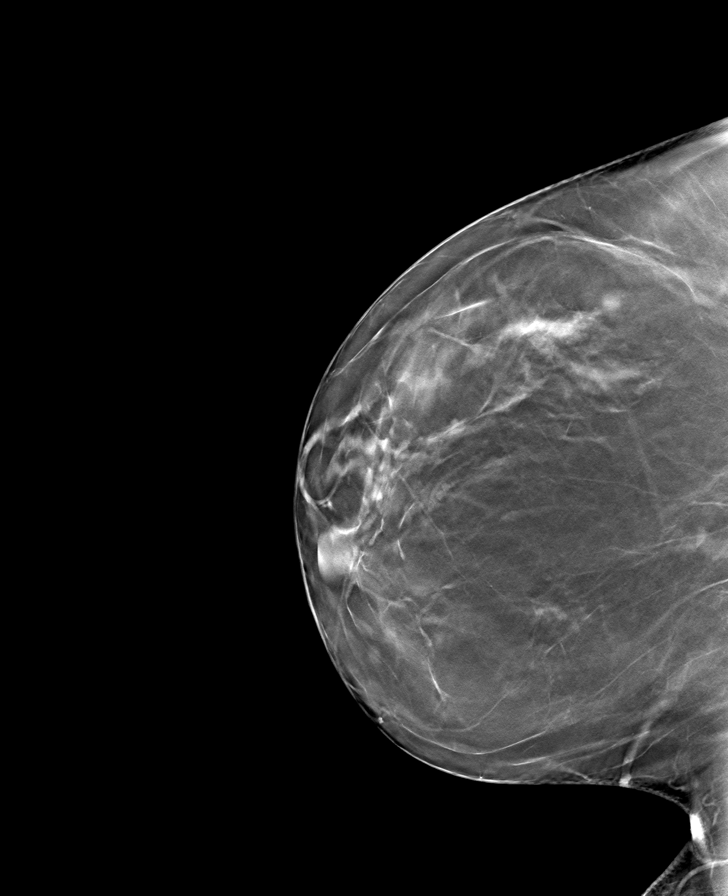

[L CC tomo · tomo slice 41/81.0]
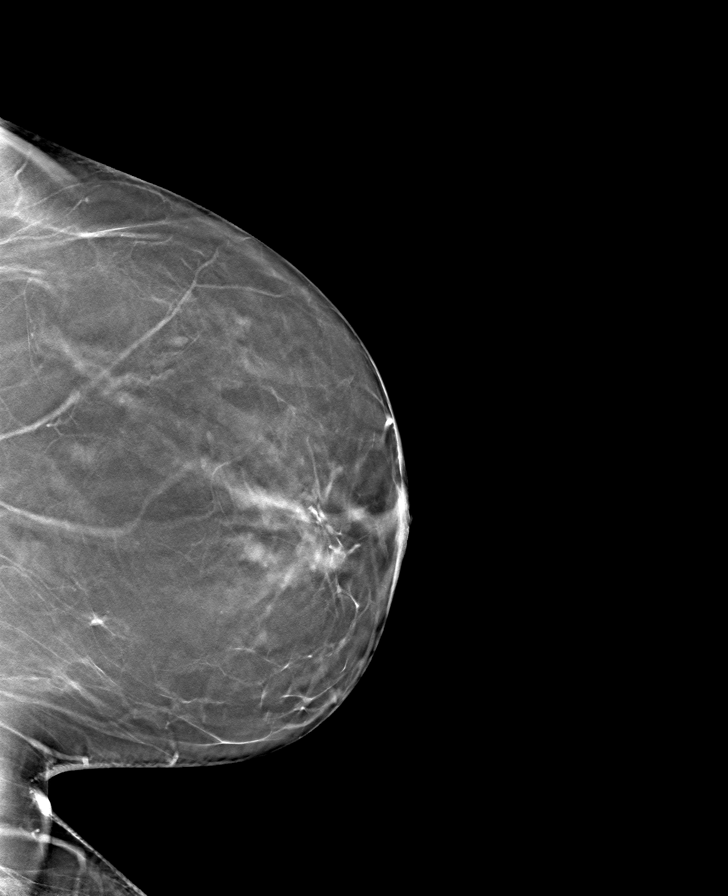

[R MLO tomo · tomo slice 45/89.0]
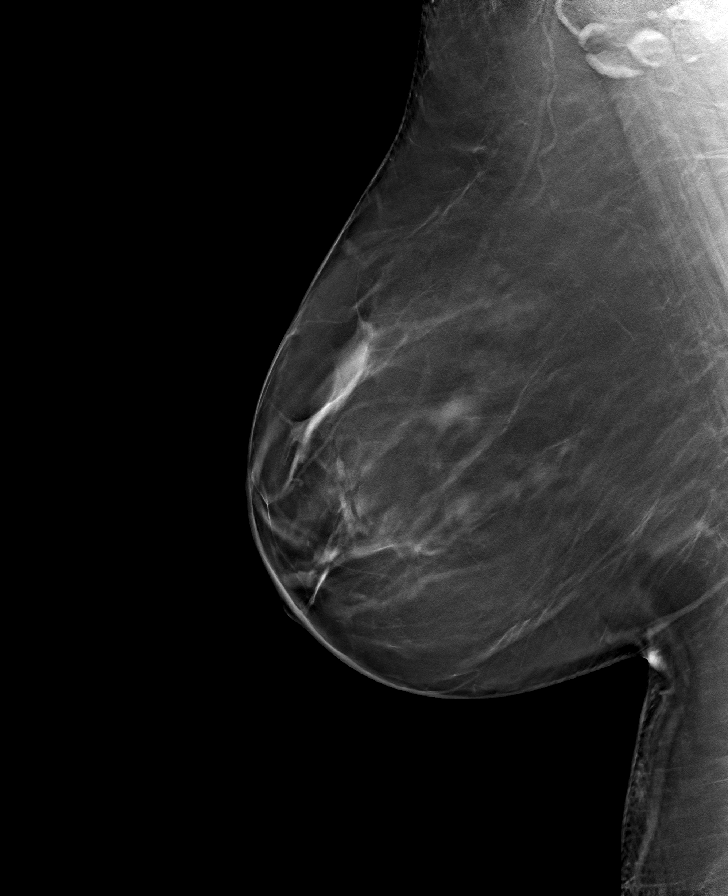

[8 of 24 positions shown; findings below may reference images not displayed]

FINDINGS: In the left breast, a possible mass warrants further evaluation. In
the right breast, no findings suspicious for malignancy.
IMPRESSION: Further evaluation is suggested for a possible mass in the left
breast.

RECOMMENDATION:
Diagnostic mammogram and possibly ultrasound of the left breast.
(Code:[id number])

The patient will be contacted regarding the findings, and additional
imaging will be scheduled.

BI-RADS CATEGORY  0: Incomplete. Need additional imaging evaluation
and/or prior mammograms for comparison.

## 2023-06-13 ENCOUNTER — Ambulatory Visit
Admission: RE | Admit: 2023-06-13 | Discharge: 2023-06-13 | Disposition: A | Discharge status: Home / Self Care | Payer: Managed Care, Other (non HMO) | Source: Ambulatory Visit | Attending: Obstetrics and Gynecology | Admitting: Obstetrics and Gynecology

## 2023-06-13 DIAGNOSIS — Z1231 Encounter for screening mammogram for malignant neoplasm of breast: Secondary | ICD-10-CM | POA: Diagnosis present

## 2023-12-19 ENCOUNTER — Other Ambulatory Visit: Payer: Self-pay

## 2023-12-19 MED ORDER — MIRABEGRON ER 50 MG PO TB24
50.0000 mg | ORAL_TABLET | Freq: Every day | ORAL | 9 refills | Status: DC
  Filled 2023-12-19: qty 90, 90d supply, fill #0

## 2023-12-19 MED ORDER — ERGOCALCIFEROL 1.25 MG (50000 UT) PO CAPS
50000.0000 [IU] | ORAL_CAPSULE | ORAL | 1 refills | Status: AC
  Filled 2023-12-19: qty 4, 28d supply, fill #0
  Filled 2024-01-14: qty 4, 28d supply, fill #1

## 2024-01-01 ENCOUNTER — Other Ambulatory Visit: Payer: Self-pay

## 2024-01-01 MED ORDER — DICYCLOMINE HCL 10 MG PO CAPS
10.0000 mg | ORAL_CAPSULE | Freq: Three times a day (TID) | ORAL | 11 refills | Status: AC
  Filled 2024-01-01: qty 120, 30d supply, fill #0

## 2024-01-01 MED ORDER — MONTELUKAST SODIUM 10 MG PO TABS
10.0000 mg | ORAL_TABLET | Freq: Every day | ORAL | 1 refills | Status: AC
  Filled 2024-01-21: qty 90, 90d supply, fill #0

## 2024-01-14 ENCOUNTER — Other Ambulatory Visit: Payer: Self-pay

## 2024-01-21 ENCOUNTER — Other Ambulatory Visit: Payer: Self-pay

## 2024-01-21 MED ORDER — PANTOPRAZOLE SODIUM 40 MG PO TBEC
40.0000 mg | DELAYED_RELEASE_TABLET | Freq: Every day | ORAL | 1 refills | Status: AC
  Filled 2024-01-21 (×2): qty 90, 90d supply, fill #0
  Filled 2024-04-23: qty 90, 90d supply, fill #1

## 2024-02-28 ENCOUNTER — Other Ambulatory Visit: Payer: Self-pay | Admitting: Obstetrics and Gynecology

## 2024-02-28 DIAGNOSIS — Z1231 Encounter for screening mammogram for malignant neoplasm of breast: Secondary | ICD-10-CM

## 2024-03-19 ENCOUNTER — Other Ambulatory Visit: Payer: Self-pay

## 2024-03-19 MED ORDER — AZELASTINE HCL 0.1 % NA SOLN
1.0000 | Freq: Two times a day (BID) | NASAL | 11 refills | Status: AC
  Filled 2024-03-19: qty 30, 50d supply, fill #0

## 2024-03-19 MED ORDER — AMOXICILLIN-POT CLAVULANATE 875-125 MG PO TABS
1.0000 | ORAL_TABLET | Freq: Two times a day (BID) | ORAL | 0 refills | Status: AC
  Filled 2024-03-19: qty 14, 7d supply, fill #0

## 2024-04-23 ENCOUNTER — Other Ambulatory Visit: Payer: Self-pay

## 2024-04-28 ENCOUNTER — Other Ambulatory Visit: Payer: Self-pay

## 2024-04-29 ENCOUNTER — Other Ambulatory Visit: Payer: Self-pay

## 2024-04-29 MED ORDER — MIRABEGRON ER 50 MG PO TB24
50.0000 mg | ORAL_TABLET | Freq: Every day | ORAL | 1 refills | Status: AC
  Filled 2024-04-29: qty 90, 90d supply, fill #0

## 2024-06-15 ENCOUNTER — Ambulatory Visit
Admission: RE | Admit: 2024-06-15 | Discharge: 2024-06-15 | Disposition: A | Discharge status: Home / Self Care | Source: Ambulatory Visit | Attending: Obstetrics and Gynecology | Admitting: Obstetrics and Gynecology

## 2024-06-15 DIAGNOSIS — Z1231 Encounter for screening mammogram for malignant neoplasm of breast: Secondary | ICD-10-CM | POA: Insufficient documentation
